# Patient Record
Sex: Female | Born: 1990 | Race: Black or African American | Hispanic: No | Marital: Single | Smoking: Never smoker
Health system: Southern US, Community
[De-identification: ages and names within clinical notes are randomized; demographics above are authoritative.]

## PROBLEM LIST (undated history)

## (undated) DIAGNOSIS — Z789 Other specified health status: Secondary | ICD-10-CM

## (undated) DIAGNOSIS — O139 Gestational [pregnancy-induced] hypertension without significant proteinuria, unspecified trimester: Secondary | ICD-10-CM

## (undated) HISTORY — PX: NO PAST SURGERIES: SHX2092

---

## 2015-03-09 ENCOUNTER — Ambulatory Visit (INDEPENDENT_AMBULATORY_CARE_PROVIDER_SITE_OTHER): Payer: Managed Care, Other (non HMO) | Admitting: Certified Nurse Midwife

## 2015-03-09 ENCOUNTER — Encounter: Payer: Self-pay | Admitting: Certified Nurse Midwife

## 2015-03-09 VITALS — BP 123/83 | HR 79 | Temp 98.2°F | Wt 129.6 lb

## 2015-03-09 DIAGNOSIS — Z30011 Encounter for initial prescription of contraceptive pills: Secondary | ICD-10-CM

## 2015-03-09 DIAGNOSIS — B373 Candidiasis of vulva and vagina: Secondary | ICD-10-CM

## 2015-03-09 DIAGNOSIS — B3731 Acute candidiasis of vulva and vagina: Secondary | ICD-10-CM

## 2015-03-09 DIAGNOSIS — R399 Unspecified symptoms and signs involving the genitourinary system: Secondary | ICD-10-CM | POA: Diagnosis not present

## 2015-03-09 DIAGNOSIS — N390 Urinary tract infection, site not specified: Secondary | ICD-10-CM

## 2015-03-09 LAB — POCT URINE PREGNANCY: Preg Test, Ur: NEGATIVE

## 2015-03-09 MED ORDER — NORGESTIM-ETH ESTRAD TRIPHASIC 0.18/0.215/0.25 MG-35 MCG PO TABS: 1.0000 | ORAL_TABLET | Freq: Every day | ORAL | Status: DC

## 2015-03-09 MED ORDER — FLUCONAZOLE 100 MG PO TABS: 100.0000 mg | ORAL_TABLET | Freq: Once | ORAL | Status: DC

## 2015-03-09 MED ORDER — NITROFURANTOIN MONOHYD MACRO 100 MG PO CAPS: 100.0000 mg | ORAL_CAPSULE | Freq: Two times a day (BID) | ORAL | Status: DC

## 2015-03-09 NOTE — Progress Notes (Signed)
Patient ID: Charlotte Walton, female   DOB: Sep 20, 1990, 24 y.o.   MRN: [id number]   Chief Complaint  Patient presents with  . Vaginitis    frequent yeast infection, possible UTI    HPI Charlotte Walton is a 24 y.o. female.  Here with c/o dysuria for about a week, along with vaginal discharge/itching.  Currently sexually active using condoms, desires OCPs.   Patient states that she gets >6 UTIs/year along with frequent yeast infections.   HPI  History reviewed. No pertinent past medical history.  History reviewed. No pertinent past surgical history.  History reviewed. No pertinent family history.  Social History Social History  Substance Use Topics  . Smoking status: Never Smoker   . Smokeless tobacco: None  . Alcohol Use: 0.0 oz/week    0 Standard drinks or equivalent per week    Not on File  Current Outpatient Prescriptions  Medication Sig Dispense Refill  . fluconazole (DIFLUCAN) 100 MG tablet Take 1 tablet (100 mg total) by mouth once. Repeat dose in 48-72 hour. 3 tablet 0  . nitrofurantoin, macrocrystal-monohydrate, (MACROBID) 100 MG capsule Take 1 capsule (100 mg total) by mouth 2 (two) times daily. 14 capsule 0  . Norgestimate-Ethinyl Estradiol Triphasic (TRI-SPRINTEC) 0.18/0.215/0.25 MG-35 MCG tablet Take 1 tablet by mouth daily. 1 Package 11   No current facility-administered medications for this visit.    Review of Systems Review of Systems Constitutional: negative for fatigue and weight loss Respiratory: negative for cough and wheezing Cardiovascular: negative for chest pain, fatigue and palpitations Gastrointestinal: negative for abdominal pain and change in bowel habits Genitourinary:+ dysuria, +vaginal discharge with itching Integument/breast: negative for nipple discharge Musculoskeletal:negative for myalgias Neurological: negative for gait problems and tremors Behavioral/Psych: negative for abusive relationship, depression Endocrine: negative for temperature  intolerance     Blood pressure 123/83, pulse 79, temperature 98.2 F (36.8 C), weight 129 lb 9.6 oz (58.786 kg), last menstrual period 02/26/2015.  Physical Exam Physical Exam General:   alert  Skin:   no rash or abnormalities  Lungs:   clear to auscultation bilaterally  Heart:   regular rate and rhythm, S1, S2 normal, no murmur, click, rub or gallop  Breasts:   normal without suspicious masses, skin or nipple changes or axillary nodes  Abdomen:  normal findings: no organomegaly, soft, non-tender and no hernia  Pelvis:  External genitalia: normal general appearance Urinary system: urethral meatus normal and bladder without fullness, nontender Vaginal: normal without tenderness, induration or masses.  + white thick vaginal discharge Cervix: normal appearance Adnexa: normal bimanual exam Uterus: anteverted and non-tender, normal size    50% of 25 min visit spent on counseling and coordination of care.   Data Reviewed Previous medical hx, medications  Assessment     UTI Vulvovaginal candidiasis Contraception counseling/management     Plan    Orders Placed This Encounter  Procedures  . Urine culture  . SureSwab, Vaginosis/Vaginitis Plus  . POCT urine pregnancy   Meds ordered this encounter  Medications  . fluconazole (DIFLUCAN) 100 MG tablet    Sig: Take 1 tablet (100 mg total) by mouth once. Repeat dose in 48-72 hour.    Dispense:  3 tablet    Refill:  0  . nitrofurantoin, macrocrystal-monohydrate, (MACROBID) 100 MG capsule    Sig: Take 1 capsule (100 mg total) by mouth 2 (two) times daily.    Dispense:  14 capsule    Refill:  0  . Norgestimate-Ethinyl Estradiol Triphasic (TRI-SPRINTEC) 0.18/0.215/0.25 MG-35 MCG tablet  Sig: Take 1 tablet by mouth daily.    Dispense:  1 Package    Refill:  11    Need to obtain previous records Possible management options include: urology consult Follow up with annual exam

## 2015-03-12 LAB — URINE CULTURE: Colony Count: >=100000

## 2015-03-13 ENCOUNTER — Other Ambulatory Visit: Payer: Self-pay | Admitting: Certified Nurse Midwife

## 2015-03-14 ENCOUNTER — Ambulatory Visit (INDEPENDENT_AMBULATORY_CARE_PROVIDER_SITE_OTHER): Payer: Managed Care, Other (non HMO) | Admitting: Certified Nurse Midwife

## 2015-03-14 ENCOUNTER — Encounter: Payer: Self-pay | Admitting: Certified Nurse Midwife

## 2015-03-14 ENCOUNTER — Other Ambulatory Visit: Payer: Self-pay | Admitting: Certified Nurse Midwife

## 2015-03-14 VITALS — BP 123/80 | HR 83 | Temp 99.0°F | Ht 65.5 in | Wt 132.0 lb

## 2015-03-14 DIAGNOSIS — Z01419 Encounter for gynecological examination (general) (routine) without abnormal findings: Secondary | ICD-10-CM

## 2015-03-14 DIAGNOSIS — Z113 Encounter for screening for infections with a predominantly sexual mode of transmission: Secondary | ICD-10-CM | POA: Diagnosis not present

## 2015-03-14 DIAGNOSIS — N390 Urinary tract infection, site not specified: Secondary | ICD-10-CM | POA: Diagnosis not present

## 2015-03-14 DIAGNOSIS — B3731 Acute candidiasis of vulva and vagina: Secondary | ICD-10-CM

## 2015-03-14 DIAGNOSIS — N760 Acute vaginitis: Secondary | ICD-10-CM | POA: Diagnosis not present

## 2015-03-14 DIAGNOSIS — B9689 Other specified bacterial agents as the cause of diseases classified elsewhere: Secondary | ICD-10-CM

## 2015-03-14 DIAGNOSIS — A499 Bacterial infection, unspecified: Secondary | ICD-10-CM | POA: Diagnosis not present

## 2015-03-14 DIAGNOSIS — R399 Unspecified symptoms and signs involving the genitourinary system: Secondary | ICD-10-CM | POA: Diagnosis not present

## 2015-03-14 DIAGNOSIS — B373 Candidiasis of vulva and vagina: Secondary | ICD-10-CM | POA: Diagnosis not present

## 2015-03-14 LAB — CBC WITH DIFFERENTIAL/PLATELET
Basophils Absolute: 0 10*3/uL (ref 0.0–0.1)
Basophils Relative: 0 % (ref 0–1)
EOS ABS: 0.4 10*3/uL (ref 0.0–0.7)
Eosinophils Relative: 3 % (ref 0–5)
HCT: 40.6 % (ref 36.0–46.0)
HEMOGLOBIN: 13.5 g/dL (ref 12.0–15.0)
LYMPHS ABS: 2.6 10*3/uL (ref 0.7–4.0)
Lymphocytes Relative: 22 % (ref 12–46)
MCH: 29.7 pg (ref 26.0–34.0)
MCHC: 33.3 g/dL (ref 30.0–36.0)
MCV: 89.2 fL (ref 78.0–100.0)
MONO ABS: 0.8 10*3/uL (ref 0.1–1.0)
MONOS PCT: 7 % (ref 3–12)
MPV: 9.7 fL (ref 8.6–12.4)
NEUTROS PCT: 68 % (ref 43–77)
Neutro Abs: 8 10*3/uL — ABNORMAL HIGH (ref 1.7–7.7)
PLATELETS: 274 10*3/uL (ref 150–400)
RBC: 4.55 MIL/uL (ref 3.87–5.11)
RDW: 12.7 % (ref 11.5–15.5)
WBC: 11.8 10*3/uL — ABNORMAL HIGH (ref 4.0–10.5)

## 2015-03-14 LAB — COMPREHENSIVE METABOLIC PANEL
ALBUMIN: 4.5 g/dL (ref 3.6–5.1)
ALT: 16 U/L (ref 6–29)
AST: 21 U/L (ref 10–30)
Alkaline Phosphatase: 45 U/L (ref 33–115)
BUN: 16 mg/dL (ref 7–25)
CHLORIDE: 101 mmol/L (ref 98–110)
CO2: 28 mmol/L (ref 20–31)
Calcium: 9.3 mg/dL (ref 8.6–10.2)
Creat: 0.94 mg/dL (ref 0.50–1.10)
Glucose, Bld: 80 mg/dL (ref 65–99)
POTASSIUM: 3.8 mmol/L (ref 3.5–5.3)
Sodium: 139 mmol/L (ref 135–146)
TOTAL PROTEIN: 7.4 g/dL (ref 6.1–8.1)
Total Bilirubin: 1.1 mg/dL (ref 0.2–1.2)

## 2015-03-14 LAB — SURESWAB, VAGINOSIS/VAGINITIS PLUS
Atopobium vaginae: NOT DETECTED Log (cells/mL)
BV CATEGORY: UNDETERMINED — AB
C. GLABRATA, DNA: NOT DETECTED
C. TROPICALIS, DNA: NOT DETECTED
C. albicans, DNA: NOT DETECTED
C. parapsilosis, DNA: NOT DETECTED
C. trachomatis RNA, TMA: NOT DETECTED
Gardnerella vaginalis: 6.4 Log (cells/mL)
LACTOBACILLUS SPECIES: 6.8 Log (cells/mL)
MEGASPHAERA SPECIES: NOT DETECTED Log (cells/mL)
N. gonorrhoeae RNA, TMA: NOT DETECTED
T. vaginalis RNA, QL TMA: NOT DETECTED

## 2015-03-14 LAB — HDL CHOLESTEROL: HDL: 60 mg/dL (ref >=46)

## 2015-03-14 LAB — CHOLESTEROL, TOTAL: CHOLESTEROL: 131 mg/dL (ref 125–200)

## 2015-03-14 LAB — TRIGLYCERIDES: Triglycerides: 83 mg/dL (ref <150)

## 2015-03-14 MED ORDER — METRONIDAZOLE 500 MG PO TABS: 500.0000 mg | ORAL_TABLET | Freq: Two times a day (BID) | ORAL | Status: DC

## 2015-03-14 MED ORDER — FLUCONAZOLE 100 MG PO TABS: 100.0000 mg | ORAL_TABLET | Freq: Once | ORAL | Status: DC

## 2015-03-14 MED ORDER — HYLAFEM VA SUPP: 1.0000 | Freq: Every day | VAGINAL | Status: DC

## 2015-03-14 MED ORDER — TERCONAZOLE 0.4 % VA CREA: 1.0000 | TOPICAL_CREAM | Freq: Every day | VAGINAL | Status: DC

## 2015-03-14 NOTE — Patient Instructions (Signed)
Bacterial Vaginosis °Bacterial vaginosis is a vaginal infection that occurs when the normal balance of bacteria in the vagina is disrupted. It results from an overgrowth of certain bacteria. This is the most common vaginal infection in women of childbearing age. Treatment is important to prevent complications, especially in pregnant women, as it can cause a premature delivery. °CAUSES  °Bacterial vaginosis is caused by an increase in harmful bacteria that are normally present in smaller amounts in the vagina. Several different kinds of bacteria can cause bacterial vaginosis. However, the reason that the condition develops is not fully understood. °RISK FACTORS °Certain activities or behaviors can put you at an increased risk of developing bacterial vaginosis, including: °· Having a new sex partner or multiple sex partners. °· Douching. °· Using an intrauterine device (IUD) for contraception. °Women do not get bacterial vaginosis from toilet seats, bedding, swimming pools, or contact with objects around them. °SIGNS AND SYMPTOMS  °Some women with bacterial vaginosis have no signs or symptoms. Common symptoms include: °· Grey vaginal discharge. °· A fishlike odor with discharge, especially after sexual intercourse. °· Itching or burning of the vagina and vulva. °· Burning or pain with urination. °DIAGNOSIS  °Your health care provider will take a medical history and examine the vagina for signs of bacterial vaginosis. A sample of vaginal fluid may be taken. Your health care provider will look at this sample under a microscope to check for bacteria and abnormal cells. A vaginal pH test may also be done.  °TREATMENT  °Bacterial vaginosis may be treated with antibiotic medicines. These may be given in the form of a pill or a vaginal cream. A second round of antibiotics may be prescribed if the condition comes back after treatment.  °HOME CARE INSTRUCTIONS  °· Only take over-the-counter or prescription medicines as  directed by your health care provider. °· If antibiotic medicine was prescribed, take it as directed. Make sure you finish it even if you start to feel better. °· Do not have sex until treatment is completed. °· Tell all sexual partners that you have a vaginal infection. They should see their health care provider and be treated if they have problems, such as a mild rash or itching. °· Practice safe sex by using condoms and only having one sex partner. °SEEK MEDICAL CARE IF:  °· Your symptoms are not improving after 3 days of treatment. °· You have increased discharge or pain. °· You have a fever. °MAKE SURE YOU:  °· Understand these instructions. °· Will watch your condition. °· Will get help right away if you are not doing well or get worse. °FOR MORE INFORMATION  °Centers for Disease Control and Prevention, Division of STD Prevention: www.cdc.gov/std °American Sexual Health Association (ASHA): www.ashastd.org  °Document Released: 07/14/2005 Document Revised: 05/04/2013 Document Reviewed: 02/23/2013 °ExitCare® Patient Information ©2015 [employer]. This information is not intended to replace advice given to you by your health care provider. Make sure you discuss any questions you have with your health care provider. °Candidal Vulvovaginitis °Candidal vulvovaginitis is an infection of the vagina and vulva. The vulva is the skin around the opening of the vagina. This may cause itching and discomfort in and around the vagina.  °HOME CARE °· Only take medicine as told by your doctor. °· Do not have sex (intercourse) until the infection is healed or as told by your doctor. °· Practice safe sex. °· Tell your sex partner about your infection. °· Do not douche or use tampons. °· Wear cotton   underwear. Do not wear tight pants or panty hose. °· Eat yogurt. This may help treat and prevent yeast infections. °GET HELP RIGHT AWAY IF:  °· You have a fever. °· Your problems get worse during treatment or do not get better in 3  days. °· You have discomfort, irritation, or itching in your vagina or vulva area. °· You have pain after sex. °· You start to get belly (abdominal) pain. °MAKE SURE YOU: °· Understand these instructions. °· Will watch your condition. °· Will get help right away if you are not doing well or get worse. °Document Released: 10/10/2008 Document Revised: 07/19/2013 Document Reviewed: 10/10/2008 °ExitCare® Patient Information ©2015 [employer]. This information is not intended to replace advice given to you by your health care provider. Make sure you discuss any questions you have with your health care provider. ° °

## 2015-03-14 NOTE — Progress Notes (Signed)
Patient ID: Charlotte Walton, female   DOB: 06/22/1991, 24 y.o.   MRN: [id number]   Subjective:      Charlotte Walton is a 24 y.o. female here for a routine exam.  Current complaints: none.  Menses regular, last about 5-6 days, denies heavy bleeding or clots, cramping.  States that she gets chronic BV/yeast infections.  Sexually active, last encounter a few weeks ago, did not use condoms.    Works full time.  Desires full std screening exam.    Personal health questionnaire:  Is patient Ashkenazi Jewish, have a family history of breast and/or ovarian cancer: no Is there a family history of uterine cancer diagnosed at age < 65, gastrointestinal cancer, urinary tract cancer, family member who is a Personnel officer syndrome-associated carrier: no Is the patient overweight and hypertensive, family history of diabetes, personal history of gestational diabetes, preeclampsia or PCOS: no Is patient over 43, have PCOS,  family history of premature CHD under age 30, diabetes, smoke, have hypertension or peripheral artery disease:  no At any time, has a partner hit, kicked or otherwise hurt or frightened you?: no Over the past 2 weeks, have you felt down, depressed or hopeless?: no Over the past 2 weeks, have you felt little interest or pleasure in doing things?:no   Gynecologic History Patient's last menstrual period was 02/26/2015. Contraception: OCP (estrogen/progesterone) Last Pap: 03/11/2014. Results were: normal Last mammogram: N/A.   Obstetric History OB History  Gravida Para Term Preterm AB SAB TAB Ectopic Multiple Living  0 0 0 0 0 0 0 0 0 0         No past medical history on file.  No past surgical history on file.   Current outpatient prescriptions:  .  metroNIDAZOLE (FLAGYL) 500 MG tablet, Take 1 tablet (500 mg total) by mouth 2 (two) times daily., Disp: 14 tablet, Rfl: 0 .  nitrofurantoin, macrocrystal-monohydrate, (MACROBID) 100 MG capsule, Take 1 capsule (100 mg total) by mouth 2 (two) times  daily., Disp: 14 capsule, Rfl: 0 .  Norgestimate-Ethinyl Estradiol Triphasic (TRI-SPRINTEC) 0.18/0.215/0.25 MG-35 MCG tablet, Take 1 tablet by mouth daily., Disp: 1 Package, Rfl: 11 .  fluconazole (DIFLUCAN) 100 MG tablet, Take 1 tablet (100 mg total) by mouth once. Repeat dose in 48-72 hour., Disp: 3 tablet, Rfl: 0 .  Homeopathic Products (HYLAFEM) SUPP, Place 1 suppository vaginally at bedtime., Disp: 6 suppository, Rfl: 0 .  terconazole (TERAZOL 7) 0.4 % vaginal cream, Place 1 applicator vaginally at bedtime., Disp: 45 g, Rfl: 0 Not on File  Social History  Substance Use Topics  . Smoking status: Never Smoker   . Smokeless tobacco: Not on file  . Alcohol Use: 0.0 oz/week    0 Standard drinks or equivalent per week    No family history on file.    Review of Systems  Constitutional: negative for fatigue and weight loss Respiratory: negative for cough and wheezing Cardiovascular: negative for chest pain, fatigue and palpitations Gastrointestinal: negative for abdominal pain and change in bowel habits Musculoskeletal:negative for myalgias Neurological: negative for gait problems and tremors Behavioral/Psych: negative for abusive relationship, depression Endocrine: negative for temperature intolerance   Genitourinary:negative for abnormal menstrual periods, genital lesions, hot flashes, sexual problems and vaginal discharge Integument/breast: negative for breast lump, breast tenderness, nipple discharge and skin lesion(s)    Objective:       BP 123/80 mmHg  Pulse 83  Temp(Src) 99 F (37.2 C)  Ht 5' 5.5" (1.664 m)  Wt 132 lb (59.875 kg)  BMI 21.62 kg/m2  LMP 02/26/2015 General:   alert  Skin:   no rash or abnormalities  Lungs:   clear to auscultation bilaterally  Heart:   regular rate and rhythm, S1, S2 normal, no murmur, click, rub or gallop  Breasts:   normal without suspicious masses, skin or nipple changes or axillary nodes  Abdomen:  normal findings: no organomegaly,  soft, non-tender and no hernia  Pelvis:  External genitalia: normal general appearance Urinary system: urethral meatus normal and bladder without fullness, nontender Vaginal: normal without tenderness, induration or masses Cervix: normal appearance Adnexa: normal bimanual exam Uterus: anteverted and non-tender, normal size   Lab Review Urine pregnancy test Labs reviewed yes Radiologic studies reviewed no  50% of 30 min visit spent on counseling and coordination of care.   Assessment:    Healthy female exam.   Chronic BV/yeast  Plan:    Education reviewed: calcium supplements, depression evaluation, low fat, low cholesterol diet, safe sex/STD prevention, self breast exams, skin cancer screening and weight bearing exercise. Contraception: OCP (estrogen/progesterone). Follow up in: 1 year.   Meds ordered this encounter  Medications  . Homeopathic Products (HYLAFEM) SUPP    Sig: Place 1 suppository vaginally at bedtime.    Dispense:  6 suppository    Refill:  0  . fluconazole (DIFLUCAN) 100 MG tablet    Sig: Take 1 tablet (100 mg total) by mouth once. Repeat dose in 48-72 hour.    Dispense:  3 tablet    Refill:  0  . metroNIDAZOLE (FLAGYL) 500 MG tablet    Sig: Take 1 tablet (500 mg total) by mouth 2 (two) times daily.    Dispense:  14 tablet    Refill:  0  . terconazole (TERAZOL 7) 0.4 % vaginal cream    Sig: Place 1 applicator vaginally at bedtime.    Dispense:  45 g    Refill:  0   Orders Placed This Encounter  Procedures  . HIV antibody (with reflex)  . Hepatitis B surface antigen  . RPR  . Hepatitis C antibody  . CBC with Differential/Platelet  . Comprehensive metabolic panel  . TSH  . Cholesterol, total  . Triglycerides  . HDL cholesterol  . Hemoglobin A1c   Need to obtain previous records Possible management options include:Metrogel 2x/week for 4-6 months

## 2015-03-15 LAB — HEPATITIS B SURFACE ANTIGEN: Hepatitis B Surface Ag: NEGATIVE

## 2015-03-15 LAB — HEMOGLOBIN A1C
HEMOGLOBIN A1C: 5.6 % (ref <5.7)
Mean Plasma Glucose: 114 mg/dL (ref <117)

## 2015-03-15 LAB — PAP IG W/ RFLX HPV ASCU

## 2015-03-15 LAB — HEPATITIS C ANTIBODY: HCV Ab: NEGATIVE

## 2015-03-15 LAB — RPR

## 2015-03-15 LAB — HIV ANTIBODY (ROUTINE TESTING W REFLEX): HIV: NONREACTIVE

## 2015-03-15 LAB — TSH: TSH: 2.475 u[IU]/mL (ref 0.350–4.500)

## 2015-07-29 NOTE — L&D Delivery Note (Addendum)
Delivery Note   At 3:44 PM a viable female named Charlotte Walton was delivered via Vaginal, Spontaneous Delivery (Presentation: LOA ).  APGAR:pending NICU input  weight 3 lb 1.4 oz (1400 g).   Placenta status: spontaneous, complete with 3 Vx Cord. 1 loose nuchal cord delivered over shoulder Cord pH: pending  Anesthesia:  Epidural Episiotomy: None Lacerations: None Est. Blood Loss (mL):  300  Mom to postpartum.  Baby to NICU.  Nakema Fake A 04/08/2016, 4:19 PM  Apgars 3,5,7 Cord pH 7.27

## 2015-08-10 ENCOUNTER — Other Ambulatory Visit: Payer: Self-pay | Admitting: Obstetrics

## 2015-08-13 ENCOUNTER — Encounter (HOSPITAL_COMMUNITY): Payer: Self-pay | Admitting: Emergency Medicine

## 2015-08-13 ENCOUNTER — Emergency Department (HOSPITAL_COMMUNITY)
Admission: EM | Admit: 2015-08-13 | Discharge: 2015-08-14 | Discharge status: Against Medical Advice | Payer: Self-pay | Attending: Emergency Medicine | Admitting: Emergency Medicine

## 2015-08-13 DIAGNOSIS — R1013 Epigastric pain: Secondary | ICD-10-CM | POA: Insufficient documentation

## 2015-08-13 DIAGNOSIS — R51 Headache: Secondary | ICD-10-CM | POA: Insufficient documentation

## 2015-08-13 DIAGNOSIS — R079 Chest pain, unspecified: Secondary | ICD-10-CM | POA: Insufficient documentation

## 2015-08-13 DIAGNOSIS — R509 Fever, unspecified: Secondary | ICD-10-CM | POA: Insufficient documentation

## 2015-08-13 DIAGNOSIS — R61 Generalized hyperhidrosis: Secondary | ICD-10-CM | POA: Insufficient documentation

## 2015-08-13 NOTE — ED Notes (Signed)
Pt states she went to her dr today and he sent her here for further testing  Pt states she has had fever, headache, night sweats, and chills for the past couple of days  Pt states she has had some chest pain and stomach pain  Pt states during the day she feels okay  Pt states today she started having a stiff neck so she went to urgent care and was sent here to rule out meningitis

## 2015-08-14 NOTE — ED Notes (Signed)
Patient called for assigned room, no answer.

## 2015-08-14 NOTE — ED Notes (Signed)
Pt called for room, no answer.

## 2015-08-14 NOTE — ED Notes (Signed)
Called to take to treatment room  No response from lobby 

## 2015-09-29 ENCOUNTER — Other Ambulatory Visit: Payer: Self-pay | Admitting: Obstetrics

## 2015-11-12 LAB — OB RESULTS CONSOLE RUBELLA ANTIBODY, IGM: RUBELLA: IMMUNE

## 2015-11-12 LAB — OB RESULTS CONSOLE GC/CHLAMYDIA
CHLAMYDIA, DNA PROBE: NEGATIVE
Gonorrhea: NEGATIVE

## 2015-11-12 LAB — OB RESULTS CONSOLE HEPATITIS B SURFACE ANTIGEN: HEP B S AG: NEGATIVE

## 2015-11-12 LAB — OB RESULTS CONSOLE RPR: RPR: NONREACTIVE

## 2015-11-12 LAB — OB RESULTS CONSOLE HIV ANTIBODY (ROUTINE TESTING): HIV: NONREACTIVE

## 2016-03-18 ENCOUNTER — Ambulatory Visit: Payer: Managed Care, Other (non HMO) | Admitting: Certified Nurse Midwife

## 2016-03-19 ENCOUNTER — Encounter (HOSPITAL_COMMUNITY): Payer: Self-pay | Admitting: *Deleted

## 2016-03-19 ENCOUNTER — Observation Stay (HOSPITAL_COMMUNITY)
Admission: AD | Admit: 2016-03-19 | Discharge: 2016-03-20 | Disposition: A | Discharge status: Home / Self Care | Payer: Managed Care, Other (non HMO) | Source: Ambulatory Visit | Attending: Obstetrics & Gynecology | Admitting: Obstetrics & Gynecology

## 2016-03-19 DIAGNOSIS — O133 Gestational [pregnancy-induced] hypertension without significant proteinuria, third trimester: Secondary | ICD-10-CM | POA: Diagnosis present

## 2016-03-19 DIAGNOSIS — Z3A28 28 weeks gestation of pregnancy: Secondary | ICD-10-CM | POA: Diagnosis not present

## 2016-03-19 DIAGNOSIS — O1493 Unspecified pre-eclampsia, third trimester: Secondary | ICD-10-CM | POA: Diagnosis not present

## 2016-03-19 DIAGNOSIS — O1490 Unspecified pre-eclampsia, unspecified trimester: Secondary | ICD-10-CM

## 2016-03-19 DIAGNOSIS — Z1389 Encounter for screening for other disorder: Secondary | ICD-10-CM

## 2016-03-19 HISTORY — DX: Gestational (pregnancy-induced) hypertension without significant proteinuria, unspecified trimester: O13.9

## 2016-03-19 HISTORY — DX: Other specified health status: Z78.9

## 2016-03-19 LAB — COMPREHENSIVE METABOLIC PANEL
ALT: 23 U/L (ref 14–54)
AST: 26 U/L (ref 15–41)
Albumin: 3 g/dL — ABNORMAL LOW (ref 3.5–5.0)
Alkaline Phosphatase: 82 U/L (ref 38–126)
Anion gap: 8 (ref 5–15)
BUN: 10 mg/dL (ref 6–20)
CHLORIDE: 107 mmol/L (ref 101–111)
CO2: 21 mmol/L — AB (ref 22–32)
CREATININE: 0.68 mg/dL (ref 0.44–1.00)
Calcium: 8.5 mg/dL — ABNORMAL LOW (ref 8.9–10.3)
GFR calc non Af Amer: >60 mL/min (ref >60)
Glucose, Bld: 77 mg/dL (ref 65–99)
POTASSIUM: 4.1 mmol/L (ref 3.5–5.1)
SODIUM: 136 mmol/L (ref 135–145)
Total Bilirubin: 0.5 mg/dL (ref 0.3–1.2)
Total Protein: 6.8 g/dL (ref 6.5–8.1)

## 2016-03-19 LAB — CBC
HCT: 32.9 % — ABNORMAL LOW (ref 36.0–46.0)
Hemoglobin: 11.6 g/dL — ABNORMAL LOW (ref 12.0–15.0)
MCH: 30.4 pg (ref 26.0–34.0)
MCHC: 35.3 g/dL (ref 30.0–36.0)
MCV: 86.4 fL (ref 78.0–100.0)
PLATELETS: 211 10*3/uL (ref 150–400)
RBC: 3.81 MIL/uL — ABNORMAL LOW (ref 3.87–5.11)
RDW: 13.5 % (ref 11.5–15.5)
WBC: 14.2 10*3/uL — AB (ref 4.0–10.5)

## 2016-03-19 LAB — URINALYSIS, ROUTINE W REFLEX MICROSCOPIC
BILIRUBIN URINE: NEGATIVE
Glucose, UA: NEGATIVE mg/dL
KETONES UR: NEGATIVE mg/dL
Leukocytes, UA: NEGATIVE
NITRITE: NEGATIVE
PROTEIN: 100 mg/dL — AB
SPECIFIC GRAVITY, URINE: 1.015 (ref 1.005–1.030)
pH: 6 (ref 5.0–8.0)

## 2016-03-19 LAB — URINE MICROSCOPIC-ADD ON
Bacteria, UA: NONE SEEN
RBC / HPF: NONE SEEN RBC/hpf (ref 0–5)

## 2016-03-19 LAB — ABO/RH: ABO/RH(D): AB POS

## 2016-03-19 LAB — TYPE AND SCREEN
ABO/RH(D): AB POS
ANTIBODY SCREEN: NEGATIVE

## 2016-03-19 LAB — PROTEIN / CREATININE RATIO, URINE
Creatinine, Urine: 112 mg/dL
Protein Creatinine Ratio: 0.87 mg/mg{Cre} — ABNORMAL HIGH (ref 0.00–0.15)
TOTAL PROTEIN, URINE: 97 mg/dL

## 2016-03-19 MED ORDER — CALCIUM CARBONATE ANTACID 500 MG PO CHEW: 2.0000 | CHEWABLE_TABLET | ORAL | Status: DC | PRN

## 2016-03-19 MED ORDER — PRENATAL MULTIVITAMIN CH
1.0000 | ORAL_TABLET | Freq: Every day | ORAL | Status: DC
  Administered 2016-03-20: 1 via ORAL
  Filled 2016-03-19: qty 1

## 2016-03-19 MED ORDER — HYDRALAZINE HCL 20 MG/ML IJ SOLN: 10.0000 mg | Freq: Once | INTRAMUSCULAR | Status: DC | PRN

## 2016-03-19 MED ORDER — BETAMETHASONE SOD PHOS & ACET 6 (3-3) MG/ML IJ SUSP
12.0000 mg | INTRAMUSCULAR | Status: AC
Start: 2016-03-19 — End: 2016-03-20
  Administered 2016-03-19 – 2016-03-20 (×2): 12 mg via INTRAMUSCULAR
  Filled 2016-03-19 (×2): qty 2

## 2016-03-19 MED ORDER — LABETALOL HCL 5 MG/ML IV SOLN: 20.0000 mg | INTRAVENOUS | Status: DC | PRN

## 2016-03-19 MED ORDER — ACETAMINOPHEN 325 MG PO TABS: 650.0000 mg | ORAL_TABLET | ORAL | Status: DC | PRN

## 2016-03-19 MED ORDER — ZOLPIDEM TARTRATE 5 MG PO TABS: 5.0000 mg | ORAL_TABLET | Freq: Every evening | ORAL | Status: DC | PRN

## 2016-03-19 MED ORDER — DOCUSATE SODIUM 100 MG PO CAPS
100.0000 mg | ORAL_CAPSULE | Freq: Every day | ORAL | Status: DC
  Filled 2016-03-19: qty 1

## 2016-03-19 NOTE — MAU Note (Signed)
Pt states her BP at [facility] was 145/102 prior to coming to MAU.  Denies HA, visual changes, or epigastric pain.  Pt states she has noticed some swelling in arms, face, feet, ankles, and hands.  Good fetal movement.

## 2016-03-19 NOTE — MAU Provider Note (Signed)
Chief Complaint:  Hypertension   First Provider Initiated Contact with Patient 03/19/16 1322      Charlotte Walton is a 25 y.o. G1P0000 at [id number] who presents to maternity admissions reporting elevated blood pressure seen at drug store.  Also has generalized edema, but states that is better today. . She reports good fetal movement, denies LOF, vaginal bleeding, vaginal itching/burning, urinary symptoms, h/a, dizziness, n/v, diarrhea, constipation or fever/chills.  She denies headache, visual changes or RUQ abdominal pain.  Hypertension  This is a new problem. The current episode started today. The problem is unchanged. Associated symptoms include peripheral edema. Pertinent negatives include no anxiety, blurred vision, chest pain, headaches, malaise/fatigue or shortness of breath. There are no associated agents to hypertension. There are no known risk factors for coronary artery disease. Past treatments include nothing. There are no compliance problems.    RN note: Pt states her BP at [facility] was 145/102 prior to coming to MAU.  Denies HA, visual changes, or epigastric pain.  Pt states she has noticed some swelling in arms, face, feet, ankles, and hands.  Good fetal movement  Past Medical History: Past Medical History:  Diagnosis Date  . Medical history non-contributory     Past obstetric history: OB History  Gravida Para Term Preterm AB Living  1 0 0 0 0 0  SAB TAB Ectopic Multiple Live Births  0 0 0 0      # Outcome Date GA Lbr Len/2nd Weight Sex Delivery Anes PTL Lv  1 Current               Past Surgical History: Past Surgical History:  Procedure Laterality Date  . NO PAST SURGERIES      Family History: History reviewed. No pertinent family history.  Social History: Social History  Substance Use Topics  . Smoking status: Never Smoker  . Smokeless tobacco: Never Used  . Alcohol use No     Comment: occ    Allergies: No Known Allergies  Meds:  Prescriptions Prior to  Admission  Medication Sig Dispense Refill Last Dose  . fluconazole (DIFLUCAN) 100 MG tablet Take 1 tablet (100 mg total) by mouth once. Repeat dose in 48-72 hour. 3 tablet 0   . Homeopathic Products (HYLAFEM) SUPP Place 1 suppository vaginally at bedtime. 6 suppository 0   . metroNIDAZOLE (FLAGYL) 500 MG tablet Take 1 tablet (500 mg total) by mouth 2 (two) times daily. 14 tablet 0   . nitrofurantoin, macrocrystal-monohydrate, (MACROBID) 100 MG capsule TAKE 1 CAPSULE(100 MG) BY MOUTH TWICE DAILY 14 capsule 0   . Norgestimate-Ethinyl Estradiol Triphasic (TRI-SPRINTEC) 0.18/0.215/0.25 MG-35 MCG tablet Take 1 tablet by mouth daily. 1 Package 11 Taking  . terconazole (TERAZOL 7) 0.4 % vaginal cream Place 1 applicator vaginally at bedtime. 45 g 0     I have reviewed patient's Past Medical Hx, Surgical Hx, Family Hx, Social Hx, medications and allergies.   ROS:  Review of Systems  Constitutional: Negative for chills, fever and malaise/fatigue.  Eyes: Negative for blurred vision.  Respiratory: Negative for shortness of breath.   Cardiovascular: Negative for chest pain.  Gastrointestinal: Negative for abdominal pain, constipation, diarrhea, nausea and vomiting.  Genitourinary: Negative for dysuria, pelvic pain and vaginal bleeding.  Skin:       Swelling all over  Neurological: Negative for headaches.   Other systems negative  Physical Exam  Patient Vitals for the past 24 hrs:  BP Temp Temp src Pulse Resp SpO2 Height Weight  03/19/16 1247 (!)  146/101 - - 85 - - - -  03/19/16 1232 148/92 - - 90 - - - -  03/19/16 1216 151/97 - - 91 - - - -  03/19/16 1201 145/97 - - 88 - - - -  03/19/16 1151 - - - - - - 5' 7.5" (1.715 m) 176 lb 12.8 oz (80.2 kg)  03/19/16 1147 143/96 - - 79 - 98 % - -  03/19/16 1141 158/96 98.6 F (37 C) Oral 80 18 98 % - -   Constitutional: Well-developed, well-nourished female in no acute distress.  Cardiovascular: normal rate and rhythm Respiratory: normal effort, clear  to auscultation bilaterally GI: Abd soft, non-tender, gravid appropriate for gestational age.   No rebound or guarding. MS: Extremities nontender, Trace edema, normal ROM Neurologic: Alert and oriented x 4. DTRs 3+ with one beat of clonus GU: Neg CVAT.  FHT:  Baseline 145 , moderate variability, accelerations present, no decelerations Contractions:  Rare   Labs: Results for orders placed or performed during the hospital encounter of 03/19/16 (from the past 24 hour(s))  Urinalysis, Routine w reflex microscopic (not at [facility])     Status: Abnormal   Collection Time: 03/19/16 11:25 AM  Result Value Ref Range   Color, Urine YELLOW YELLOW   APPearance HAZY (A) CLEAR   Specific Gravity, Urine 1.015 1.005 - 1.030   pH 6.0 5.0 - 8.0   Glucose, UA NEGATIVE NEGATIVE mg/dL   Hgb urine dipstick TRACE (A) NEGATIVE   Bilirubin Urine NEGATIVE NEGATIVE   Ketones, ur NEGATIVE NEGATIVE mg/dL   Protein, ur 295100 (A) NEGATIVE mg/dL   Nitrite NEGATIVE NEGATIVE   Leukocytes, UA NEGATIVE NEGATIVE  Urine microscopic-add on     Status: Abnormal   Collection Time: 03/19/16 11:25 AM  Result Value Ref Range   Squamous Epithelial / LPF 6-30 (A) NONE SEEN   WBC, UA 0-5 0 - 5 WBC/hpf   RBC / HPF NONE SEEN 0 - 5 RBC/hpf   Bacteria, UA NONE SEEN NONE SEEN  Protein / creatinine ratio, urine     Status: Abnormal   Collection Time: 03/19/16 11:25 AM  Result Value Ref Range   Creatinine, Urine 112.00 mg/dL   Total Protein, Urine 97 mg/dL   Protein Creatinine Ratio 0.87 (H) 0.00 - 0.15 mg/mg[Cre]  CBC     Status: Abnormal   Collection Time: 03/19/16  1:27 PM  Result Value Ref Range   WBC 14.2 (H) 4.0 - 10.5 K/uL   RBC 3.81 (L) 3.87 - 5.11 MIL/uL   Hemoglobin 11.6 (L) 12.0 - 15.0 g/dL   HCT 28.432.9 (L) 13.236.0 - 44.046.0 %   MCV 86.4 78.0 - 100.0 fL   MCH 30.4 26.0 - 34.0 pg   MCHC 35.3 30.0 - 36.0 g/dL   RDW 10.213.5 72.511.5 - 36.615.5 %   Platelets 211 150 - 400 K/uL  Comprehensive metabolic panel     Status: Abnormal    Collection Time: 03/19/16  1:27 PM  Result Value Ref Range   Sodium 136 135 - 145 mmol/L   Potassium 4.1 3.5 - 5.1 mmol/L   Chloride 107 101 - 111 mmol/L   CO2 21 (L) 22 - 32 mmol/L   Glucose, Bld 77 65 - 99 mg/dL   BUN 10 6 - 20 mg/dL   Creatinine, Ser 4.400.68 0.44 - 1.00 mg/dL   Calcium 8.5 (L) 8.9 - 10.3 mg/dL   Total Protein 6.8 6.5 - 8.1 g/dL   Albumin 3.0 (L) 3.5 - 5.0  g/dL   AST 26 15 - 41 U/L   ALT 23 14 - 54 U/L   Alkaline Phosphatase 82 38 - 126 U/L   Total Bilirubin 0.5 0.3 - 1.2 mg/dL   GFR calc non Af Amer >60 >60 mL/min   GFR calc Af Amer >60 >60 mL/min   Anion gap 8 5 - 15    Imaging:  No results found.  MAU Course/MDM: I have ordered labs and reviewed results.  NST reviewed Consult Dr Su Hilt with presentation, exam findings and test results.  Will admit for betamethasone and observation  Assessment: [facility] at [redacted]w[redacted]d Preeclampsia  Plan: Admit to Antenatal per DR Su Hilt Routine antenatal orders Betamethasone series MD to follow  Wynelle Bourgeois CNM, MSN Certified Nurse-Midwife 03/19/2016 1:22 PM

## 2016-03-19 NOTE — H&P (Signed)
Chief Complaint:  Hypertension   First Provider Initiated Contact with Patient 03/19/16 1322      Charlotte Walton is a 25 y.o. G1P0000 at [redacted]w[redacted]d who presents to maternity admissions reporting elevated blood pressure seen at drug store.  Also has generalized edema, but states that is better today. . She reports good fetal movement, denies LOF, vaginal bleeding, vaginal itching/burning, urinary symptoms, h/a, dizziness, n/v, diarrhea, constipation or fever/chills.  She denies headache, visual changes or RUQ abdominal pain.  Hypertension  This is a new problem. The current episode started today. The problem is unchanged. Associated symptoms include peripheral edema. Pertinent negatives include no anxiety, blurred vision, chest pain, headaches, malaise/fatigue or shortness of breath. There are no associated agents to hypertension. There are no known risk factors for coronary artery disease. Past treatments include nothing. There are no compliance problems.    RN note: Pt states her BP at [facility] was 145/102 prior to coming to [facility]. Denies HA, visual changes, or epigastric pain. Pt states she has noticed some swelling in arms, face, feet, ankles, and hands. Good fetal movement  Past Medical History:     Past Medical History:  Diagnosis Date  . Medical history non-contributory     Past obstetric history:                 OB History  Gravida Para Term Preterm AB Living  1 0 0 0 0 0  SAB TAB Ectopic Multiple Live Births  0 0 0 0      # Outcome Date GA Lbr Len/2nd Weight Sex Delivery Anes PTL Lv  1 Current               Past Surgical History:      Past Surgical History:  Procedure Laterality Date  . NO PAST SURGERIES      Family History: History reviewed. No pertinent family history.  Social History:       Social History  Substance Use Topics  . Smoking status: Never Smoker  . Smokeless tobacco: Never Used  . Alcohol use No     Comment: occ     Allergies: No Known Allergies  Meds:         Prescriptions Prior to Admission  Medication Sig Dispense Refill Last Dose  . fluconazole (DIFLUCAN) 100 MG tablet Take 1 tablet (100 mg total) by mouth once. Repeat dose in 48-72 hour. 3 tablet 0   . Homeopathic Products (HYLAFEM) SUPP Place 1 suppository vaginally at bedtime. 6 suppository 0   . metroNIDAZOLE (FLAGYL) 500 MG tablet Take 1 tablet (500 mg total) by mouth 2 (two) times daily. 14 tablet 0   . nitrofurantoin, macrocrystal-monohydrate, (MACROBID) 100 MG capsule TAKE 1 CAPSULE(100 MG) BY MOUTH TWICE DAILY 14 capsule 0   . Norgestimate-Ethinyl Estradiol Triphasic (TRI-SPRINTEC) 0.18/0.215/0.25 MG-35 MCG tablet Take 1 tablet by mouth daily. 1 Package 11 Taking  . terconazole (TERAZOL 7) 0.4 % vaginal cream Place 1 applicator vaginally at bedtime. 45 g 0     I have reviewed patient's Past Medical Hx, Surgical Hx, Family Hx, Social Hx, medications and allergies.   ROS:  Review of Systems  Constitutional: Negative for chills, fever and malaise/fatigue.  Eyes: Negative for blurred vision.  Respiratory: Negative for shortness of breath.   Cardiovascular: Negative for chest pain.  Gastrointestinal: Negative for abdominal pain, constipation, diarrhea, nausea and vomiting.  Genitourinary: Negative for dysuria, pelvic pain and vaginal bleeding.  Skin:       Swelling all  over  Neurological: Negative for headaches.   Other systems negative  Physical Exam  Patient Vitals for the past 24 hrs:  BP Temp Temp src Pulse Resp SpO2 Height Weight  03/19/16 1247 (!) 146/101 - - 85 - - - -  03/19/16 1232 148/92 - - 90 - - - -  03/19/16 1216 151/97 - - 91 - - - -  03/19/16 1201 145/97 - - 88 - - - -  03/19/16 1151 - - - - - - 5' 7.5" (1.715 m) 176 lb 12.8 oz (80.2 kg)  03/19/16 1147 143/96 - - 79 - 98 % - -  03/19/16 1141 158/96 98.6 F (37 C) Oral 80 18 98 % - -   Constitutional: Well-developed, well-nourished female in  no acute distress.  Cardiovascular: normal rate and rhythm Respiratory: normal effort, clear to auscultation bilaterally GI: Abd soft, non-tender, gravid appropriate for gestational age.   No rebound or guarding. MS: Extremities nontender, Trace edema, normal ROM Neurologic: Alert and oriented x 4. DTRs 3+ with one beat of clonus GU: Neg CVAT.  FHT:  Baseline 145 , moderate variability, accelerations present, no decelerations Contractions:  Rare   Labs: Lab Results Last 24 Hours       Results for orders placed or performed during the hospital encounter of 03/19/16 (from the past 24 hour(s))  Urinalysis, Routine w reflex microscopic (not [facility])     Status: Abnormal   Collection Time: 03/19/16 11:25 AM  Result Value Ref Range   Color, Urine YELLOW YELLOW   APPearance HAZY (A) CLEAR   Specific Gravity, Urine 1.015 1.005 - 1.030   pH 6.0 5.0 - 8.0   Glucose, UA NEGATIVE NEGATIVE mg/dL   Hgb urine dipstick TRACE (A) NEGATIVE   Bilirubin Urine NEGATIVE NEGATIVE   Ketones, ur NEGATIVE NEGATIVE mg/dL   Protein, ur 784100 (A) NEGATIVE mg/dL   Nitrite NEGATIVE NEGATIVE   Leukocytes, UA NEGATIVE NEGATIVE  Urine microscopic-add on     Status: Abnormal   Collection Time: 03/19/16 11:25 AM  Result Value Ref Range   Squamous Epithelial / LPF 6-30 (A) NONE SEEN   WBC, UA 0-5 0 - 5 WBC/hpf   RBC / HPF NONE SEEN 0 - 5 RBC/hpf   Bacteria, UA NONE SEEN NONE SEEN  Protein / creatinine ratio, urine     Status: Abnormal   Collection Time: 03/19/16 11:25 AM  Result Value Ref Range   Creatinine, Urine 112.00 mg/dL   Total Protein, Urine 97 mg/dL   Protein Creatinine Ratio 0.87 (H) 0.00 - 0.15 mg/mg[Cre]  CBC     Status: Abnormal   Collection Time: 03/19/16  1:27 PM  Result Value Ref Range   WBC 14.2 (H) 4.0 - 10.5 K/uL   RBC 3.81 (L) 3.87 - 5.11 MIL/uL   Hemoglobin 11.6 (L) 12.0 - 15.0 g/dL   HCT 69.632.9 (L) 29.536.0 - 28.446.0 %   MCV 86.4 78.0 - 100.0 fL   MCH 30.4 26.0 -  34.0 pg   MCHC 35.3 30.0 - 36.0 g/dL   RDW 13.213.5 44.011.5 - 10.215.5 %   Platelets 211 150 - 400 K/uL  Comprehensive metabolic panel     Status: Abnormal   Collection Time: 03/19/16  1:27 PM  Result Value Ref Range   Sodium 136 135 - 145 mmol/L   Potassium 4.1 3.5 - 5.1 mmol/L   Chloride 107 101 - 111 mmol/L   CO2 21 (L) 22 - 32 mmol/L   Glucose, Bld 77  65 - 99 mg/dL   BUN 10 6 - 20 mg/dL   Creatinine, Ser 1.610.68 0.44 - 1.00 mg/dL   Calcium 8.5 (L) 8.9 - 10.3 mg/dL   Total Protein 6.8 6.5 - 8.1 g/dL   Albumin 3.0 (L) 3.5 - 5.0 g/dL   AST 26 15 - 41 U/L   ALT 23 14 - 54 U/L   Alkaline Phosphatase 82 38 - 126 U/L   Total Bilirubin 0.5 0.3 - 1.2 mg/dL   GFR calc non Af Amer >60 >60 mL/min   GFR calc Af Amer >60 >60 mL/min   Anion gap 8 5 - 15      Imaging:  Imaging Results  No results found.    MAU Course/MDM: I have ordered labs and reviewed results.  NST reviewed Consult Dr Su Hiltoberts with presentation, exam findings and test results.  Will admit for betamethasone and observation  Assessment: SIUP at 8555w1d Preeclampsia  Plan: Admit to Antenatal per DR Su Hiltoberts Routine antenatal orders Betamethasone series 24 hour urine MD to follow  Illene BolusLori Clemmons CNM 03/19/2016 @ 856pm  Will order u/s with MFM for the morning and repeat labs.

## 2016-03-20 ENCOUNTER — Inpatient Hospital Stay (HOSPITAL_COMMUNITY): Payer: Managed Care, Other (non HMO)

## 2016-03-20 DIAGNOSIS — O133 Gestational [pregnancy-induced] hypertension without significant proteinuria, third trimester: Secondary | ICD-10-CM | POA: Diagnosis not present

## 2016-03-20 LAB — CBC
HEMATOCRIT: 33.5 % — AB (ref 36.0–46.0)
HEMOGLOBIN: 11.8 g/dL — AB (ref 12.0–15.0)
MCH: 30.5 pg (ref 26.0–34.0)
MCHC: 35.2 g/dL (ref 30.0–36.0)
MCV: 86.6 fL (ref 78.0–100.0)
Platelets: 227 10*3/uL (ref 150–400)
RBC: 3.87 MIL/uL (ref 3.87–5.11)
RDW: 13.5 % (ref 11.5–15.5)
WBC: 19.3 10*3/uL — AB (ref 4.0–10.5)

## 2016-03-20 LAB — COMPREHENSIVE METABOLIC PANEL
ALBUMIN: 2.9 g/dL — AB (ref 3.5–5.0)
ALK PHOS: 87 U/L (ref 38–126)
ALT: 26 U/L (ref 14–54)
AST: 31 U/L (ref 15–41)
Anion gap: 6 (ref 5–15)
BILIRUBIN TOTAL: 0.4 mg/dL (ref 0.3–1.2)
BUN: 14 mg/dL (ref 6–20)
CALCIUM: 9.4 mg/dL (ref 8.9–10.3)
CO2: 21 mmol/L — ABNORMAL LOW (ref 22–32)
CREATININE: 0.8 mg/dL (ref 0.44–1.00)
Chloride: 107 mmol/L (ref 101–111)
GFR calc Af Amer: >60 mL/min (ref >60)
GFR calc non Af Amer: >60 mL/min (ref >60)
GLUCOSE: 162 mg/dL — AB (ref 65–99)
Potassium: 4.7 mmol/L (ref 3.5–5.1)
Sodium: 134 mmol/L — ABNORMAL LOW (ref 135–145)
TOTAL PROTEIN: 6.6 g/dL (ref 6.5–8.1)

## 2016-03-20 LAB — PROTEIN, URINE, 24 HOUR
Collection Interval-UPROT: 24 hours
Protein, 24H Urine: 1440 mg/d — ABNORMAL HIGH (ref 50–100)
Protein, Urine: 60 mg/dL
Urine Total Volume-UPROT: 2400 mL

## 2016-03-20 LAB — LACTATE DEHYDROGENASE: LDH: 161 U/L (ref 98–192)

## 2016-03-20 LAB — URIC ACID: Uric Acid, Serum: 6.2 mg/dL (ref 2.3–6.6)

## 2016-03-20 NOTE — Progress Notes (Signed)
Inpatient Diabetes Program Recommendations  Diabetes Treatment Program Recommendations  ADA Standards of Care 2016 Diabetes in Pregnancy Target Glucose Ranges:  Fasting: 60 - 90 mg/dL Preprandial: 60 - 130105 mg/dL 1 hr postprandial: Less than 140mg /dL (from first bite of meal) 2 hr postprandial: Less than 120 mg/dL (from first bite of meal)    Results for Charlotte Walton, Charlotte Walton (MRN [id number]) as of 03/20/2016 07:36  Ref. Range 03/19/2016 13:27 03/20/2016 06:17  Glucose Latest Ref Range: 65 - 99 mg/dL 77 295162 (H)    Review of Glycemic Control  Diabetes history: No Outpatient Diabetes medications: NA Current orders for Inpatient glycemic control: None  Inpatient Diabetes Program Recommendations: Correction (SSI): Noted patient received Betamethasone 12 mg on 03/19/16 and fasting lab glucose 162 mg/dl this morning. Patient will receive second dose of Betamethasone today. While inpatient, may want to use Diabetic Pregnant Patient order set to order CBGs with Novolog 0-14 units QID (fasting and 2 hour post prandial).  Thanks, Charlotte PennerMarie Paulette Lynch, RN, MSN, CDE Diabetes Coordinator Inpatient Diabetes Program [phone] (Team Pager from 8am to 5pm) [phone] ([facility] office) [phone] [facility] office) [phone] [facility] office)

## 2016-03-20 NOTE — Discharge Summary (Signed)
Physician Discharge Summary  Patient ID: Charlotte Walton MRN: [id number] DOB/AGE: 1991/02/22 25 y.o.  Admit date: 03/19/2016 Discharge date: 03/20/2016  Admission Diagnoses:  Discharge Diagnoses:  Active Problems:   Gestational hypertension   Preeclampsia--without severe features   Discharged Condition: stable  Hospital Course: Admitted 03/19/16 due to elevated BP.  PCR was 0.87, other PIH labs WNL.  Did not require any anti-hypertensives during hospitalization.  US showed EFW 2+11, 54%ile, AFI 15, anterior placenta.  FHR remained Category 1, with some mild irritability.  She received 2 doses betamethasone and completed a 24 hour urine collection.  Patient desired outpatient management.  Per consult with Dr. Alesia Richards, she was d/c'd home after completion of that collection.  PIH precautions were reviewed with the patient.    Outpatient f/u will include: Daily fetal kick counts -Growth ultrasound every 3 weeks -AFI every week with ROB visit until 32 weeks, then can begin weekly BPP's with ROB visits -None stress test every week with BP check.   -For glucose challenge test in 1 week at the office.   Consults: None  Significant Diagnostic Studies: labs:  Results for orders placed or performed during the hospital encounter of 03/19/16 (from the past 48 hour(s))  Urinalysis, Routine w reflex microscopic (not at [facility])     Status: Abnormal   Collection Time: 03/19/16 11:25 AM  Result Value Ref Range   Color, Urine YELLOW YELLOW   APPearance HAZY (A) CLEAR   Specific Gravity, Urine 1.015 1.005 - 1.030   pH 6.0 5.0 - 8.0   Glucose, UA NEGATIVE NEGATIVE mg/dL   Hgb urine dipstick TRACE (A) NEGATIVE   Bilirubin Urine NEGATIVE NEGATIVE   Ketones, ur NEGATIVE NEGATIVE mg/dL   Protein, ur 100 (A) NEGATIVE mg/dL   Nitrite NEGATIVE NEGATIVE   Leukocytes, UA NEGATIVE NEGATIVE  Urine microscopic-add on     Status: Abnormal   Collection Time: 03/19/16 11:25 AM  Result Value Ref Range   Squamous  Epithelial / LPF 6-30 (A) NONE SEEN   WBC, UA 0-5 0 - 5 WBC/hpf   RBC / HPF NONE SEEN 0 - 5 RBC/hpf   Bacteria, UA NONE SEEN NONE SEEN  Protein / creatinine ratio, urine     Status: Abnormal   Collection Time: 03/19/16 11:25 AM  Result Value Ref Range   Creatinine, Urine 112.00 mg/dL   Total Protein, Urine 97 mg/dL    Comment: NO NORMAL RANGE ESTABLISHED FOR THIS TEST   Protein Creatinine Ratio 0.87 (H) 0.00 - 0.15 mg/mg[Cre]  CBC     Status: Abnormal   Collection Time: 03/19/16  1:27 PM  Result Value Ref Range   WBC 14.2 (H) 4.0 - 10.5 K/uL   RBC 3.81 (L) 3.87 - 5.11 MIL/uL   Hemoglobin 11.6 (L) 12.0 - 15.0 g/dL   HCT 32.9 (L) 36.0 - 46.0 %   MCV 86.4 78.0 - 100.0 fL   MCH 30.4 26.0 - 34.0 pg   MCHC 35.3 30.0 - 36.0 g/dL   RDW 13.5 11.5 - 15.5 %   Platelets 211 150 - 400 K/uL  Comprehensive metabolic panel     Status: Abnormal   Collection Time: 03/19/16  1:27 PM  Result Value Ref Range   Sodium 136 135 - 145 mmol/L   Potassium 4.1 3.5 - 5.1 mmol/L   Chloride 107 101 - 111 mmol/L   CO2 21 (L) 22 - 32 mmol/L   Glucose, Bld 77 65 - 99 mg/dL   BUN 10 6 - 20  mg/dL   Creatinine, Ser 0.68 0.44 - 1.00 mg/dL   Calcium 8.5 (L) 8.9 - 10.3 mg/dL   Total Protein 6.8 6.5 - 8.1 g/dL   Albumin 3.0 (L) 3.5 - 5.0 g/dL   AST 26 15 - 41 U/L   ALT 23 14 - 54 U/L   Alkaline Phosphatase 82 38 - 126 U/L   Total Bilirubin 0.5 0.3 - 1.2 mg/dL   GFR calc non Af Amer >60 >60 mL/min   GFR calc Af Amer >60 >60 mL/min    Comment: (NOTE) The eGFR has been calculated using the CKD EPI equation. This calculation has not been validated in all clinical situations. eGFR's persistently <60 mL/min signify possible Chronic Kidney Disease.    Anion gap 8 5 - 15  Type and screen [place]     Status: None   Collection Time: 03/19/16  7:31 PM  Result Value Ref Range   ABO/RH(D) AB POS    Antibody Screen NEG    Sample Expiration 03/22/2016   ABO/Rh     Status: None   Collection  Time: 03/19/16  7:31 PM  Result Value Ref Range   ABO/RH(D) AB POS   CBC     Status: Abnormal   Collection Time: 03/20/16  6:17 AM  Result Value Ref Range   WBC 19.3 (H) 4.0 - 10.5 K/uL   RBC 3.87 3.87 - 5.11 MIL/uL   Hemoglobin 11.8 (L) 12.0 - 15.0 g/dL   HCT 33.5 (L) 36.0 - 46.0 %   MCV 86.6 78.0 - 100.0 fL   MCH 30.5 26.0 - 34.0 pg   MCHC 35.2 30.0 - 36.0 g/dL   RDW 13.5 11.5 - 15.5 %   Platelets 227 150 - 400 K/uL  Comprehensive metabolic panel     Status: Abnormal   Collection Time: 03/20/16  6:17 AM  Result Value Ref Range   Sodium 134 (L) 135 - 145 mmol/L   Potassium 4.7 3.5 - 5.1 mmol/L   Chloride 107 101 - 111 mmol/L   CO2 21 (L) 22 - 32 mmol/L   Glucose, Bld 162 (H) 65 - 99 mg/dL   BUN 14 6 - 20 mg/dL   Creatinine, Ser 0.80 0.44 - 1.00 mg/dL   Calcium 9.4 8.9 - 10.3 mg/dL   Total Protein 6.6 6.5 - 8.1 g/dL   Albumin 2.9 (L) 3.5 - 5.0 g/dL   AST 31 15 - 41 U/L   ALT 26 14 - 54 U/L   Alkaline Phosphatase 87 38 - 126 U/L   Total Bilirubin 0.4 0.3 - 1.2 mg/dL   GFR calc non Af Amer >60 >60 mL/min   GFR calc Af Amer >60 >60 mL/min    Comment: (NOTE) The eGFR has been calculated using the CKD EPI equation. This calculation has not been validated in all clinical situations. eGFR's persistently <60 mL/min signify possible Chronic Kidney Disease.    Anion gap 6 5 - 15  Uric acid     Status: None   Collection Time: 03/20/16  6:17 AM  Result Value Ref Range   Uric Acid, Serum 6.2 2.3 - 6.6 mg/dL  Lactate dehydrogenase     Status: None   Collection Time: 03/20/16  6:17 AM  Result Value Ref Range   LDH 161 98 - 192 U/L    Treatments: steroids: Betamethasone course  Discharge Exam: Blood pressure (!) 144/94, pulse 74, temperature 98.8 F (37.1 C), temperature source Oral, resp. rate 18, height 5' 5.5" (  1.664 m), weight 79.4 kg (175 lb), last menstrual period 07/14/2015, SpO2 98 %.   Vitals:   03/20/16 1228 03/20/16 1229 03/20/16 1718 03/20/16 1923  BP:  (!)  146/76 (!) 139/93 (!) 144/94  Pulse:  85 80 74  Resp: 20  20 18   Temp: 98.3 F (36.8 C)   98.8 F (37.1 C)  TempSrc:    Oral  SpO2:      Weight:      Height:       General appearance: alert Resp: clear to auscultation bilaterally Cardio: regular rate and rhythm, S1, S2 normal, no murmur, click, rub or gallop Pelvic: uterus normal size, shape, and consistency Extremities: extremities normal, atraumatic, no cyanosis or edema  FHR Category 1  Disposition: D/C'd home     Medication List    STOP taking these medications   fluconazole 100 MG tablet Commonly known as:  DIFLUCAN   HYLAFEM Supp   metroNIDAZOLE 500 MG tablet Commonly known as:  FLAGYL   nitrofurantoin (macrocrystal-monohydrate) 100 MG capsule Commonly known as:  MACROBID   terconazole 0.4 % vaginal cream Commonly known as:  TERAZOL 7     TAKE these medications   acetaminophen 325 MG tablet Commonly known as:  TYLENOL Take 650 mg by mouth every 6 (six) hours as needed.   prenatal multivitamin Tabs tablet Take 1 tablet by mouth daily at 12 noon.      Follow-up [facility]. Schedule an appointment as soon as possible for a visit in 1 week(s).   Specialty:  Obstetrics and Gynecology Why:  Office will call you to schedule visits twice a week--one visit will have baby monitoring, one visit will have an ultrasound and a visit with an MD or midwife.  Call for ANY questions or concerns. Contact information: [address] [place] [address] [phone]          Signed: Donnel Saxon 03/20/2016, 7:50 PM

## 2016-03-20 NOTE — Discharge Instructions (Signed)

## 2016-03-20 NOTE — Progress Notes (Addendum)
Charlotte Walton, Charlotte Walton Female, 25 y.o., 1990/09/13 P0 @ 28 W 1 day EGA with preeclampsia without severe features   Subjective: Patient reports no complaints.  Denies headache, scotomata, blurry vision, chest pain or shortness of breath. Denies abdominal pain, nausea or vomiting.      Objective: I have reviewed patient's vital signs, intake and output, medications, labs and radiology results. Vitals:   03/19/16 1800 03/19/16 1815 03/19/16 2107 03/20/16 0633  BP: 135/90 (!) 151/89 (!) 141/90 131/86  Pulse: 75 86 85 74  Resp:  16 20   Temp:  98.5 F (36.9 C) 98.1 F (36.7 C)   TempSrc:  Oral Oral   SpO2:      Weight:  79.4 kg (175 lb)    Height:  5' 5.5" (1.664 m)     CBC    Component Value Date/Time   WBC 19.3 (H) 03/20/2016 0617   RBC 3.87 03/20/2016 0617   HGB 11.8 (L) 03/20/2016 0617   HCT 33.5 (L) 03/20/2016 0617   PLT 227 03/20/2016 0617   MCV 86.6 03/20/2016 0617   MCH 30.5 03/20/2016 0617   MCHC 35.2 03/20/2016 0617   RDW 13.5 03/20/2016 0617   LYMPHSABS 2.6 03/14/2015 1644   MONOABS 0.8 03/14/2015 1644   EOSABS 0.4 03/14/2015 1644   BASOSABS 0.0 03/14/2015 1644   CMP     Component Value Date/Time   NA 134 (L) 03/20/2016 0617   K 4.7 03/20/2016 0617   CL 107 03/20/2016 0617   CO2 21 (L) 03/20/2016 0617   GLUCOSE 162 (H) 03/20/2016 0617   BUN 14 03/20/2016 0617   CREATININE 0.80 03/20/2016 0617   CREATININE 0.94 03/14/2015 1644   CALCIUM 9.4 03/20/2016 0617   PROT 6.6 03/20/2016 0617   ALBUMIN 2.9 (L) 03/20/2016 0617   AST 31 03/20/2016 0617   ALT 26 03/20/2016 0617   ALKPHOS 87 03/20/2016 0617   BILITOT 0.4 03/20/2016 0617   GFRNONAA >60 03/20/2016 0617   GFRAA >60 03/20/2016 0617   8/24: Uric acid 6.2.  LDH 161.    EFM 8/23 @ 11pm: 130 BL, mod variability, reactive.  TOCO: + irritabilty, no distinct contractions.   Ultrasound 8/24: EFW 2lbs 11 oz (54th%) Cephalic. AFI 15cm. Anterior Placenta.    General: alert and cooperative Resp: clear to  auscultation bilaterally Cardio: regular rate and rhythm, S1, S2 normal, no murmur, click, rub or gallop GI: soft, non-tender; bowel sounds normal; no masses,  no organomegaly Extremities: extremities normal, atraumatic, no cyanosis or edema and no edema, redness or tenderness in the calves or thighs   Assessment/Plan: 25 y/o P0 @ 3228 W 1 day EGA with preeclampsia without severe features, -c/w 24 hour urine collection for total protein, to be finished at 9pm tonight  -For betamethasone # 2 at 1830 today -C/w close monitoring for signs and symptoms of preeclampsia. -If remains stable and without severe features, after second betamethasone and 24 hr urine collection, would manage as outpatient with:  -Daily fetal kick counts -Growth ultrasound every 3 weeks -AFI every week with ROB visit until 32 weeks, then can begin weekly BPP's with ROB visits -None stress test every week with BP check.   -For glucose challenge test in 1 week at the office.     LOS: 1 day    Konrad FelixKULWA,Eline Geng WAKURU, MD 03/20/2016, 7:34 AM

## 2016-03-22 ENCOUNTER — Inpatient Hospital Stay (HOSPITAL_COMMUNITY)
Admission: AD | Admit: 2016-03-22 | Discharge: 2016-04-12 | DRG: 774 | Disposition: A | Discharge status: Home / Self Care | Payer: Managed Care, Other (non HMO) | Source: Ambulatory Visit | Attending: Obstetrics and Gynecology | Admitting: Obstetrics and Gynecology

## 2016-03-22 DIAGNOSIS — O1410 Severe pre-eclampsia, unspecified trimester: Secondary | ICD-10-CM | POA: Diagnosis present

## 2016-03-22 DIAGNOSIS — O1413 Severe pre-eclampsia, third trimester: Principal | ICD-10-CM | POA: Diagnosis present

## 2016-03-22 DIAGNOSIS — Z6831 Body mass index (BMI) 31.0-31.9, adult: Secondary | ICD-10-CM

## 2016-03-22 DIAGNOSIS — Z8742 Personal history of other diseases of the female genital tract: Secondary | ICD-10-CM

## 2016-03-22 DIAGNOSIS — Z823 Family history of stroke: Secondary | ICD-10-CM

## 2016-03-22 DIAGNOSIS — O1415 Severe pre-eclampsia, complicating the puerperium: Secondary | ICD-10-CM | POA: Diagnosis present

## 2016-03-22 DIAGNOSIS — Z3A28 28 weeks gestation of pregnancy: Secondary | ICD-10-CM

## 2016-03-22 DIAGNOSIS — Z8249 Family history of ischemic heart disease and other diseases of the circulatory system: Secondary | ICD-10-CM

## 2016-03-23 ENCOUNTER — Encounter (HOSPITAL_COMMUNITY): Payer: Self-pay | Admitting: *Deleted

## 2016-03-23 DIAGNOSIS — O1413 Severe pre-eclampsia, third trimester: Secondary | ICD-10-CM | POA: Diagnosis present

## 2016-03-23 DIAGNOSIS — Z823 Family history of stroke: Secondary | ICD-10-CM | POA: Diagnosis not present

## 2016-03-23 DIAGNOSIS — O1410 Severe pre-eclampsia, unspecified trimester: Secondary | ICD-10-CM | POA: Diagnosis present

## 2016-03-23 DIAGNOSIS — Z8742 Personal history of other diseases of the female genital tract: Secondary | ICD-10-CM

## 2016-03-23 DIAGNOSIS — Z8249 Family history of ischemic heart disease and other diseases of the circulatory system: Secondary | ICD-10-CM | POA: Diagnosis not present

## 2016-03-23 DIAGNOSIS — Z3A28 28 weeks gestation of pregnancy: Secondary | ICD-10-CM | POA: Diagnosis not present

## 2016-03-23 DIAGNOSIS — R030 Elevated blood-pressure reading, without diagnosis of hypertension: Secondary | ICD-10-CM | POA: Diagnosis present

## 2016-03-23 DIAGNOSIS — Z6831 Body mass index (BMI) 31.0-31.9, adult: Secondary | ICD-10-CM | POA: Diagnosis not present

## 2016-03-23 LAB — COMPREHENSIVE METABOLIC PANEL
ALK PHOS: 87 U/L (ref 38–126)
ALT: 25 U/L (ref 14–54)
AST: 29 U/L (ref 15–41)
Albumin: 3 g/dL — ABNORMAL LOW (ref 3.5–5.0)
Anion gap: 7 (ref 5–15)
BUN: 18 mg/dL (ref 6–20)
CALCIUM: 8.7 mg/dL — AB (ref 8.9–10.3)
CHLORIDE: 106 mmol/L (ref 101–111)
CO2: 21 mmol/L — AB (ref 22–32)
CREATININE: 0.7 mg/dL (ref 0.44–1.00)
GFR calc Af Amer: >60 mL/min (ref >60)
Glucose, Bld: 104 mg/dL — ABNORMAL HIGH (ref 65–99)
Potassium: 4.1 mmol/L (ref 3.5–5.1)
SODIUM: 134 mmol/L — AB (ref 135–145)
Total Bilirubin: 0.5 mg/dL (ref 0.3–1.2)
Total Protein: 6.4 g/dL — ABNORMAL LOW (ref 6.5–8.1)

## 2016-03-23 LAB — CBC
HEMATOCRIT: 31.7 % — AB (ref 36.0–46.0)
HEMOGLOBIN: 11.2 g/dL — AB (ref 12.0–15.0)
MCH: 31.6 pg (ref 26.0–34.0)
MCHC: 35.3 g/dL (ref 30.0–36.0)
MCV: 89.5 fL (ref 78.0–100.0)
PLATELETS: 218 10*3/uL (ref 150–400)
RBC: 3.54 MIL/uL — ABNORMAL LOW (ref 3.87–5.11)
RDW: 13.3 % (ref 11.5–15.5)
WBC: 16.1 10*3/uL — ABNORMAL HIGH (ref 4.0–10.5)

## 2016-03-23 LAB — OB RESULTS CONSOLE GBS: GBS: NEGATIVE

## 2016-03-23 LAB — PROTEIN / CREATININE RATIO, URINE
Creatinine, Urine: 79 mg/dL
PROTEIN CREATININE RATIO: 3.15 mg/mg{creat} — AB (ref 0.00–0.15)
Total Protein, Urine: 249 mg/dL

## 2016-03-23 LAB — URINALYSIS, ROUTINE W REFLEX MICROSCOPIC
Bilirubin Urine: NEGATIVE
GLUCOSE, UA: NEGATIVE mg/dL
KETONES UR: NEGATIVE mg/dL
LEUKOCYTES UA: NEGATIVE
Nitrite: NEGATIVE
PH: 6.5 (ref 5.0–8.0)
Protein, ur: 100 mg/dL — AB
Specific Gravity, Urine: 1.025 (ref 1.005–1.030)

## 2016-03-23 LAB — URINE MICROSCOPIC-ADD ON

## 2016-03-23 LAB — TYPE AND SCREEN
ABO/RH(D): AB POS
Antibody Screen: NEGATIVE

## 2016-03-23 LAB — LACTATE DEHYDROGENASE: LDH: 168 U/L (ref 98–192)

## 2016-03-23 LAB — FETAL FIBRONECTIN: FETAL FIBRONECTIN: NEGATIVE

## 2016-03-23 LAB — URIC ACID: Uric Acid, Serum: 5.3 mg/dL (ref 2.3–6.6)

## 2016-03-23 LAB — GROUP B STREP BY PCR: GROUP B STREP BY PCR: NEGATIVE

## 2016-03-23 MED ORDER — LABETALOL HCL 5 MG/ML IV SOLN
20.0000 mg | INTRAVENOUS | Status: AC | PRN
  Administered 2016-04-01 – 2016-04-05 (×3): 20 mg via INTRAVENOUS
  Filled 2016-03-23: qty 8
  Filled 2016-03-23 (×3): qty 4

## 2016-03-23 MED ORDER — PRENATAL MULTIVITAMIN CH
1.0000 | ORAL_TABLET | Freq: Every day | ORAL | Status: DC
  Administered 2016-03-23 – 2016-04-04 (×13): 1 via ORAL
  Filled 2016-03-23 (×15): qty 1

## 2016-03-23 MED ORDER — ZOLPIDEM TARTRATE 5 MG PO TABS: 5.0000 mg | ORAL_TABLET | Freq: Every evening | ORAL | Status: DC | PRN

## 2016-03-23 MED ORDER — HYDRALAZINE HCL 20 MG/ML IJ SOLN
10.0000 mg | Freq: Once | INTRAMUSCULAR | Status: AC | PRN
  Administered 2016-04-06: 10 mg via INTRAVENOUS
  Filled 2016-03-23 (×2): qty 1

## 2016-03-23 MED ORDER — ACETAMINOPHEN 325 MG PO TABS
650.0000 mg | ORAL_TABLET | ORAL | Status: DC | PRN
  Administered 2016-03-25 – 2016-04-06 (×3): 650 mg via ORAL
  Filled 2016-03-23 (×4): qty 2

## 2016-03-23 MED ORDER — LACTATED RINGERS IV SOLN
INTRAVENOUS | Status: DC
  Administered 2016-03-23 – 2016-04-08 (×11): via INTRAVENOUS

## 2016-03-23 MED ORDER — CALCIUM CARBONATE ANTACID 500 MG PO CHEW: 2.0000 | CHEWABLE_TABLET | ORAL | Status: DC | PRN

## 2016-03-23 MED ORDER — DOCUSATE SODIUM 100 MG PO CAPS
100.0000 mg | ORAL_CAPSULE | Freq: Every day | ORAL | Status: DC
  Administered 2016-03-24 – 2016-04-05 (×13): 100 mg via ORAL
  Filled 2016-03-23 (×15): qty 1

## 2016-03-23 NOTE — Progress Notes (Addendum)
Hospital day # 1, pregnancy at [redacted]w[redacted]d-preE w/ severe features.  S: Denies h/a, visual changes, epigastric pain, CP, difficulty breathing, fevers, chills or N/V.      Perception of contractions: none.      Vaginal bleeding: none.       Vaginal discharge: no significant change.  States feeling well. Tolerating a regular diet.  O: BP (!) 152/96 (BP Location: Left Arm)   Pulse 78   Temp 97.8 F (36.6 C) (Oral)   Resp 16   Ht 5' 5.5" (1.664 m)   Wt 81.2 kg (179 lb)   LMP 07/14/2015 (Exact Date)   BMI 29.33 kg/m   Today's Vitals   03/23/16 1846 03/23/16 1955 03/23/16 2317 03/24/16 0443  BP:  (!) 157/98 (!) 149/103 (!) 152/96  Pulse:  83 80 78  Resp:  16 16 16   Temp:  98.7 F (37.1 C) 98.4 F (36.9 C) 97.8 F (36.6 C)  TempSrc:  Oral Oral Oral  Weight:      Height:      PainSc: 0-No pain 0-No pain 0-No pain 0-No pain       Gen: NAD      Lungs: CTAB      CV: RRR w/o M/R/G      Fetal tracings: BL 148 w/ moderate variability, +accels, no decels      Contractions: Mild, occasional uterine irritability      Uterus gravid, consistent with 28 weeks and non-tender      Extremities: extremities normal, atraumatic, no cyanosis or edema and no significant edema and no signs of DVT   BP range since admission = 126-157/82-105, and has not required IV antihypertensives thus far.          Labs:   Results for orders placed or performed during the hospital encounter of 03/22/16 (from the past 48 hour(s))  [facility], [facility]     Status: Abnormal   Collection Time: 03/22/16 11:55 PM  Result Value Ref Range   Color, Urine YELLOW YELLOW   APPearance CLOUDY (A) CLEAR   Specific Gravity, Urine 1.025 1.005 - 1.030   pH 6.5 5.0 - 8.0   Glucose, UA NEGATIVE NEGATIVE mg/dL   Hgb urine dipstick TRACE (A) NEGATIVE   Bilirubin Urine NEGATIVE NEGATIVE   Ketones, ur NEGATIVE NEGATIVE mg/dL   Protein, ur 100 (A) NEGATIVE mg/dL   Nitrite NEGATIVE NEGATIVE   Leukocytes, UA NEGATIVE NEGATIVE  Protein / creatinine ratio, urine     Status: Abnormal   Collection Time: 03/22/16 11:55 PM  Result Value Ref Range   Creatinine, Urine 79.00 mg/dL   Total Protein, Urine 249 mg/dL    Comment: NO NORMAL RANGE ESTABLISHED FOR THIS TEST RESULTS CONFIRMED BY MANUAL DILUTION    Protein Creatinine Ratio 3.15 (H) 0.00 - 0.15 mg/mg[Cre]  Urine microscopic-add on     Status: Abnormal   Collection Time: 03/22/16 11:55 PM  Result Value Ref Range   Squamous Epithelial / LPF 6-30 (A) NONE SEEN   WBC, UA 0-5 0 - 5 WBC/hpf   RBC / HPF 0-5 0 - 5 RBC/hpf   Bacteria, UA FEW (A) NONE SEEN  CBC     Status: Abnormal   Collection Time: 03/23/16 12:20 AM  Result Value Ref Range   WBC 16.1 (H) 4.0 - 10.5 K/uL   RBC 3.54 (L) 3.87 - 5.11 MIL/uL   Hemoglobin 11.2 (L) 12.0 - 15.0 g/dL   HCT 31.7 (L) 36.0 - 46.0 %  MCV 89.5 78.0 - 100.0 fL   MCH 31.6 26.0 - 34.0 pg   MCHC 35.3 30.0 - 36.0 g/dL   RDW 13.3 11.5 - 15.5 %   Platelets 218 150 - 400 K/uL  Comprehensive metabolic panel     Status: Abnormal   Collection Time: 03/23/16 12:20 AM  Result Value Ref Range   Sodium 134 (L) 135 - 145 mmol/L   Potassium 4.1 3.5 - 5.1 mmol/L   Chloride 106 101 - 111 mmol/L   CO2 21 (L) 22 - 32 mmol/L   Glucose, Bld 104 (H) 65 - 99 mg/dL   BUN 18 6 - 20 mg/dL   Creatinine, Ser 0.70 0.44 - 1.00 mg/dL   Calcium 8.7 (L) 8.9 - 10.3 mg/dL   Total Protein 6.4 (L) 6.5 - 8.1 g/dL   Albumin 3.0 (L) 3.5 - 5.0 g/dL   AST 29 15 - 41 U/L   ALT 25 14 - 54 U/L   Alkaline Phosphatase 87 38 - 126 U/L   Total Bilirubin 0.5 0.3 - 1.2 mg/dL   GFR calc non Af Amer >60 >60 mL/min   GFR calc Af Amer >60 >60 mL/min    Comment: (NOTE) The eGFR has been calculated using the CKD EPI equation. This calculation has not been validated in all clinical situations. eGFR's persistently <60 mL/min signify possible Chronic Kidney Disease.    Anion gap 7 5 - 15  Lactate dehydrogenase     Status: None    Collection Time: 03/23/16 12:20 AM  Result Value Ref Range   LDH 168 98 - 192 U/L  Uric acid     Status: None   Collection Time: 03/23/16 12:20 AM  Result Value Ref Range   Uric Acid, Serum 5.3 2.3 - 6.6 mg/dL  Type and screen [place]     Status: None   Collection Time: 03/23/16 12:20 AM  Result Value Ref Range   ABO/RH(D) AB POS    Antibody Screen NEG    Sample Expiration 03/26/2016   Group B strep by PCR     Status: None   Collection Time: 03/23/16 12:47 AM  Result Value Ref Range   Group B strep by PCR NEGATIVE NEGATIVE  Fetal fibronectin     Status: None   Collection Time: 03/23/16 12:47 AM  Result Value Ref Range   Fetal Fibronectin NEGATIVE NEGATIVE         Meds: Scheduled:  . docusate sodium  100 mg Oral Daily  . prenatal multivitamin  1 tablet Oral Q1200   . lactated ringers 125 mL/hr at 03/23/16 2200              Meds: PRN: acetaminophen, calcium carbonate, hydrALAZINE, labetalol, zolpidem  A: 36w6dadmitted on 03/23/16, second admission, now w/ preE w/ severe features     Steroid complete as of 03/21/16. White count (16.1 on 03/23/16, down from 19.3 on 03/20/16). Tmax 98.9.   P: Continue current plan of care      Upcoming tests/treatments: MFM consult, NST q shift, 1hr glucola.     WFarrel GordonCNM 03/24/2016 7:08 AM

## 2016-03-23 NOTE — Progress Notes (Signed)
Charlotte Walton, Charlotte Walton Female, 25 y.o., 1990/08/31  G1P0 @ 28W 5 D EGA with preeclampsia with severe features (severe range Bps)   Subjective: Patient reports no complaints.  She denies headache/scotomata/blurry vision/chest pain/shortness of breath/nausea/vomiting/abdominal pain/contractions/vaginal bleeding/leakage of fluid.  With normal gross fetal movement.    Objective: Vitals:   03/23/16 1309 03/23/16 1405 03/23/16 1628 03/23/16 1955  BP: 138/90 137/89 (!) 140/98 (!) 157/98  Pulse:  (!) 102 92 83  Resp: 16 16 16 16   Temp: 98.5 F (36.9 C)  98.2 F (36.8 C) 98.7 F (37.1 C)  TempSrc:    Oral  Weight:      Height:        General: alert and cooperative Resp: clear to auscultation bilaterally Cardio: regular rate and rhythm, S1, S2 normal, no murmur, click, rub or gallop GI: soft, non-tender; bowel sounds normal; no masses,  no organomegaly Extremities: extremities normal, atraumatic, no cyanosis or edema EFM: Cat 1 TOCO: No contractions.  CVX: deferred  Assessment/Plan:  25 y/o G1P0 @ 28W 5 D EGA with preeclampsia with severe features (severe range Bps), stable maternal and fetal status,  -C/w inpatient management until delivery  -MFM consult on Monday -s/p recent Betamethasone -NST Q shift - For Glucose challenge test this week.   LOS: 0 days   Konrad FelixKULWA,Charlotte Sutliff WAKURU, MD.  03/23/2016, 10:49 AM

## 2016-03-23 NOTE — Progress Notes (Signed)
Now on Antenatal Unit.  Vitals:   03/23/16 0112 03/23/16 0115 03/23/16 0135 03/23/16 0200  BP:  (!) 147/95 140/87 (!) 136/94  Pulse:  69 72 68  Resp: 18 18 18 18   Temp:  98.9 F (37.2 C)    TempSrc:  Oral    Weight: 81.2 kg (179 lb)     Height: 5' 5.5" (1.664 m)      Results for orders placed or performed during the hospital encounter of 03/22/16 (from the past 24 hour(s))  Urinalysis, Routine w reflex microscopic (not at [facility])     Status: Abnormal   Collection Time: 03/22/16 11:55 PM  Result Value Ref Range   Color, Urine YELLOW YELLOW   APPearance CLOUDY (A) CLEAR   Specific Gravity, Urine 1.025 1.005 - 1.030   pH 6.5 5.0 - 8.0   Glucose, UA NEGATIVE NEGATIVE mg/dL   Hgb urine dipstick TRACE (A) NEGATIVE   Bilirubin Urine NEGATIVE NEGATIVE   Ketones, ur NEGATIVE NEGATIVE mg/dL   Protein, ur 161100 (A) NEGATIVE mg/dL   Nitrite NEGATIVE NEGATIVE   Leukocytes, UA NEGATIVE NEGATIVE  Protein / creatinine ratio, urine     Status: Abnormal   Collection Time: 03/22/16 11:55 PM  Result Value Ref Range   Creatinine, Urine 79.00 mg/dL   Total Protein, Urine 249 mg/dL   Protein Creatinine Ratio 3.15 (H) 0.00 - 0.15 mg/mg[Cre]  Urine microscopic-add on     Status: Abnormal   Collection Time: 03/22/16 11:55 PM  Result Value Ref Range   Squamous Epithelial / LPF 6-30 (A) NONE SEEN   WBC, UA 0-5 0 - 5 WBC/hpf   RBC / HPF 0-5 0 - 5 RBC/hpf   Bacteria, UA FEW (A) NONE SEEN  CBC     Status: Abnormal   Collection Time: 03/23/16 12:20 AM  Result Value Ref Range   WBC 16.1 (H) 4.0 - 10.5 K/uL   RBC 3.54 (L) 3.87 - 5.11 MIL/uL   Hemoglobin 11.2 (L) 12.0 - 15.0 g/dL   HCT 09.631.7 (L) 04.536.0 - 40.946.0 %   MCV 89.5 78.0 - 100.0 fL   MCH 31.6 26.0 - 34.0 pg   MCHC 35.3 30.0 - 36.0 g/dL   RDW 81.113.3 91.411.5 - 78.215.5 %   Platelets 218 150 - 400 K/uL  Comprehensive metabolic panel     Status: Abnormal   Collection Time: 03/23/16 12:20 AM  Result Value Ref Range   Sodium 134 (L) 135 - 145 mmol/L   Potassium 4.1 3.5 - 5.1 mmol/L   Chloride 106 101 - 111 mmol/L   CO2 21 (L) 22 - 32 mmol/L   Glucose, Bld 104 (H) 65 - 99 mg/dL   BUN 18 6 - 20 mg/dL   Creatinine, Ser 9.560.70 0.44 - 1.00 mg/dL   Calcium 8.7 (L) 8.9 - 10.3 mg/dL   Total Protein 6.4 (L) 6.5 - 8.1 g/dL   Albumin 3.0 (L) 3.5 - 5.0 g/dL   AST 29 15 - 41 U/L   ALT 25 14 - 54 U/L   Alkaline Phosphatase 87 38 - 126 U/L   Total Bilirubin 0.5 0.3 - 1.2 mg/dL   GFR calc non Af Amer >60 >60 mL/min   GFR calc Af Amer >60 >60 mL/min   Anion gap 7 5 - 15  Lactate dehydrogenase     Status: None   Collection Time: 03/23/16 12:20 AM  Result Value Ref Range   LDH 168 98 - 192 U/L  Uric acid  Status: None   Collection Time: 03/23/16 12:20 AM  Result Value Ref Range   Uric Acid, Serum 5.3 2.3 - 6.6 mg/dL  Type and screen [facility]      Status: None   Collection Time: 03/23/16 12:20 AM  Result Value Ref Range   ABO/RH(D) AB POS    Antibody Screen NEG    Sample Expiration 03/26/2016   Group B strep by PCR     Status: None   Collection Time: 03/23/16 12:47 AM  Result Value Ref Range   Group B strep by PCR NEGATIVE NEGATIVE  Fetal fibronectin     Status: None   Collection Time: 03/23/16 12:47 AM  Result Value Ref Range   Fetal Fibronectin NEGATIVE NEGATIVE   Will continue to observe. Reviewed possibility of prolonged hospitalization with patient and family.  Nigel Bridgeman, CNM 03/23/16 2:25a

## 2016-03-23 NOTE — H&P (Signed)
Charlotte Walton is a 25 y.o. female, G1P0 at 1228 5/7 weeks, presenting with elevated BP tonight--d/c'd home from Antenatal Dept on 8/25 after 24 hours observation and dx of pre-eclampsia without severe features.    Admitted 03/19/16 due to elevated BP.  PCR was 0.87, other PIH labs WNL.  Did not require any anti-hypertensives during hospitalization.  US showed EFW 2+11, 54%ile, AFI 15, anterior placenta.  FHR remained Category 1, with some mild irritability.  She received 2 doses betamethasone and completed a 24 hour urine collection.  Patient desired outpatient management.  Per consult with Dr. Sallye OberKulwa, she was d/c'd home after completion of that collection.  PIH precautions were reviewed with the patient.   24 hour urine protein resulted at 1440 mg/24 hours.  Patient had someone take her BP at home today, with result of 170/110, then went to local pharmacies x 2, with similar results.  Denies HA or epigastric pain--noted single episode of ? blurry vision.  Reports +FM, occasional cramping.  Denies leaking or bleeding.  Patient Active Problem List   Diagnosis Date Noted  . Pre-eclampsia, severe, antepartum 03/23/2016  . History of irregular menstrual cycles 03/23/2016    History of present pregnancy: Patient entered care at 8 5/7 weeks by LMP.   EDC of 06/10/16 was established by US at 14 weeks due to irregular cycles.   Anatomy scan:  20 weeks, with limited anatomy and an anterior placenta.   Additional US evaluations:   24 2/7 weeks:  Breech, completion of anatomy, EFW 47.9%ile, cervix 3.72, normal fluid.   Significant prenatal events: Uncomplicated pregnancy until admission on 8/23 for elevated BP.   OB History    Gravida Para Term Preterm AB Living   1 0 0 0 0 0   SAB TAB Ectopic Multiple Live Births   0 0 0 0       Past Medical History:  Diagnosis Date  . Medical history non-contributory   . Pregnancy induced hypertension    Past Surgical History:  Procedure Laterality Date  . NO  PAST SURGERIES     Family History: family history includes Depression in her sister; Drug abuse in her maternal grandmother; Early death in her maternal grandmother; Hyperlipidemia in her mother; Hypertension in her maternal grandmother; Miscarriages / [place] in her sister; Stroke in her maternal grandmother.   Social History:  reports that she has never smoked. She has never used smokeless tobacco. She reports that she does not drink alcohol or use drugs.  Patient is Tree surgeonAfrican American, a Archivistcollege student, single, with FOB, Darrol AngelBlair Adams, involved and supportive.  Patient is currently accompanied by her mother and her partner's mother.   Prenatal Transfer Tool  Maternal Diabetes: Has not been tested yet. Genetic Screening: Declined Maternal Ultrasounds/Referrals: Normal Fetal Ultrasounds or other Referrals:  None Maternal Substance Abuse:  No Significant Maternal Medications:  None Significant Maternal Lab Results: Lab values include: Other: GBS by PCR pending from today's admission  TDAP 10/2015 Flu 06/28/15  ROS:  Elevated BP, cramping  No Known Allergies   Dilation: Closed Exam by:: VEmilee Hero. Khai Arrona, cnm Blood pressure 140/87, pulse 72, temperature 98.9 F (37.2 C), temperature source Oral, resp. rate 18, height 5' 5.5" (1.664 m), weight 81.2 kg (179 lb), last menstrual period 07/14/2015.  Chest clear Heart RRR without murmur Abd gravid, NT, FH 28 cm Pelvic: Cervix closed, 50%, vtx, -2 Ext: DTR 1+, no clonus, 1+ edema  FHR: Category 1 UCs:  Irregular, mild  Prenatal labs: ABO, Rh: --/--/  AB POS (08/27 0020) Antibody: NEG (08/27 0020) Rubella:  Immune RPR:  NR  HBsAg:   Neg HIV:   NR GBS:  Pending from today's admission Sickle cell/Hgb electrophoresis:  AA Pap:  02/26/15 WNL GC:  11/12/15 negative Chlamydia:  11/12/15 negative Genetic screenings:  Declined Glucola: Not done yet Other:   Hgb 12.8 at NOB, 11.8 on 03/20/16     Assessment/Plan: IUP at 28 5/7  weeks Pre-eclampsia, now with severe features S/p betamethasone course 8/23-8/24/17  Plan: Admit to Antenatal Unit per consult with Dr. Su Hilt IV anti-hypertensives per parameters PIH labs and PCR MFM consult on Monday for management and timing of delivery GBS by PCR FFN  1 hour GTT ordered for Monday, 8/128/17.  Molly Savarino CNM, [place] 03/23/2016, 1:54 AM

## 2016-03-24 MED ORDER — NIFEDIPINE ER OSMOTIC RELEASE 30 MG PO TB24
30.0000 mg | ORAL_TABLET | Freq: Every day | ORAL | Status: DC
  Administered 2016-03-24: 30 mg via ORAL
  Filled 2016-03-24: qty 1

## 2016-03-24 NOTE — Progress Notes (Signed)
Patient asleep at this time. Per MD order, vital signs not obtained at this time.  Will continue to monitor. Milon DikesKristina D Brindle Leyba, RN

## 2016-03-24 NOTE — Consult Note (Signed)
Maternal Fetal Medicine Consultation  Requesting Provider(s): Nigel Bridgeman, CNM  Reason for consultation: Preeclampsia with severe features at [redacted] weeks gestation  HPI: Charlotte Walton is a 25 yo G1P0, EDD 06/10/2016 currently at 28w 6d admitted due to preeclampsia with severe features.  She was initially admitted on 8/23 due to labile blood pressures and a PCR ratio of 0.87.  Subsequent 24-hr urine showed 1140 mg protein/24 hours.  Ultrasound at that time showed an appropriately grown fetus (54th %tile) and normal amniotic fluid volume.  She completed a course of betamethasone and was discharged home with close outpatient monitoring.  She subsequently had blood pressures at a local pharmacy of 170/110 which prompted her current admission.  Repeat PCR showed a ratio of 3.15.  Her preeclampsia labs were otherwise normal except for a uric acid of 6.2.  Blood pressures since admission have mostly been in the 150's/ high 90's.  She is not currently on any oral blood pressure medications.  The fetus is active.  She denies RUQ pain, headaches or visual changes.  OB History: OB History    Gravida Para Term Preterm AB Living   1 0 0 0 0 0   SAB TAB Ectopic Multiple Live Births   0 0 0 0        PMH:  Past Medical History:  Diagnosis Date  . Medical history non-contributory   . Pregnancy induced hypertension     PSH:  Past Surgical History:  Procedure Laterality Date  . NO PAST SURGERIES     Meds:  No current facility-administered medications on file prior to encounter.    Current Outpatient Prescriptions on File Prior to Encounter  Medication Sig Dispense Refill  . acetaminophen (TYLENOL) 325 MG tablet Take 650 mg by mouth every 6 (six) hours as needed for mild pain, moderate pain or headache.      Allergies: No Known Allergies   FH:  Family History  Problem Relation Age of Onset  . Hyperlipidemia Mother   . Miscarriages / Stillbirths Sister   . Depression Sister   . Early death  Maternal Grandmother   . Drug abuse Maternal Grandmother   . Hypertension Maternal Grandmother   . Stroke Maternal Grandmother    Soc:  Social History   Social History  . Marital status: Single    Spouse name: N/A  . Number of children: N/A  . Years of education: N/A   Occupational History  . Not on file.   Social History Main Topics  . Smoking status: Never Smoker  . Smokeless tobacco: Never Used  . Alcohol use No     Comment: occ  . Drug use: No  . Sexual activity: Yes    Birth control/ protection: None   Other Topics Concern  . Not on file   Social History Narrative  . No narrative on file    Review of Systems: no vaginal bleeding or cramping/contractions, no LOF, no nausea/vomiting. All other systems reviewed and are negative.  PE:   Vitals:   03/24/16 0443 03/24/16 0834  BP: (!) 152/96 (!) 157/95  Pulse: 78 76  Resp: 16 16  Temp: 97.8 F (36.6 C) 98.1 F (36.7 C)    Labs: CBC    Component Value Date/Time   WBC 16.1 (H) 03/23/2016 0020   RBC 3.54 (L) 03/23/2016 0020   HGB 11.2 (L) 03/23/2016 0020   HCT 31.7 (L) 03/23/2016 0020   PLT 218 03/23/2016 0020   MCV 89.5 03/23/2016 0020  MCH 31.6 03/23/2016 0020   MCHC 35.3 03/23/2016 0020   RDW 13.3 03/23/2016 0020   LYMPHSABS 2.6 03/14/2015 1644   MONOABS 0.8 03/14/2015 1644   EOSABS 0.4 03/14/2015 1644   BASOSABS 0.0 03/14/2015 1644   CMP Latest Ref Rng & Units 03/23/2016 03/20/2016 03/19/2016  Glucose 65 - 99 mg/dL 308(M104(H) 578(I162(H) 77  BUN 6 - 20 mg/dL 18 14 10   Creatinine 0.44 - 1.00 mg/dL 6.960.70 2.950.80 2.840.68  Sodium 135 - 145 mmol/L 134(L) 134(L) 136  Potassium 3.5 - 5.1 mmol/L 4.1 4.7 4.1  Chloride 101 - 111 mmol/L 106 107 107  CO2 22 - 32 mmol/L 21(L) 21(L) 21(L)  Calcium 8.9 - 10.3 mg/dL 1.3(K8.7(L) 9.4 4.4(W8.5(L)  Total Protein 6.5 - 8.1 g/dL 6.4(L) 6.6 6.8  Total Bilirubin 0.3 - 1.2 mg/dL 0.5 0.4 0.5  Alkaline Phos 38 - 126 U/L 87 87 82  AST 15 - 41 U/L 29 31 26   ALT 14 - 54 U/L 25 26 23     A/P: 1)  Single IUP at 28w 6d  2) Preeclampsia with severe features - Ms. Peddy meets criteria for preeclampsia with severe features by proteinuria and severe range blood pressures.  She is currently stable on bedrest and completed a course of betamethasone on 8/23 and 8/24.  We had a long discussion regarding anticipated course and likely need of preterm delivery  Recommendations: 1) Would start antihypertensive medications.  My first choice would be Procardia XL.  Would start out at 30 mg daily and titrate upward to achieve target blood pressures in the 140/90 range.  The maximum dose would be 120 mg total dose daily. 2) Recommend preeclampsia labs at least 2x weekly (Monday/Thursday or Tuesday/Friday) or more frequently if any significant clinical change occurs 3) At least daily fetal strips or more frequently as clinically indicated 4) Would start Magnesium sulfate and move toward delivery for: 1) LFTS > 2x normal 2) Creat > 1.2 3) Platelets < 100 4) Severe range blood pressures (SBP at or > 160 or DBP at or > 110) despite antihypertensive medications 5) Severe headache that dose not improve with medications 6) Concerns over fetal testing 5) Recommend serial ultrasounds for growth every 3-4 weeks 7) Delivery by [redacted] weeks gestation due to preeclampsia with severe  features if otherwise stable and not meeting criteria for earlier delivery 8) Would recommend NICU consultation due to likely preterm delivery   Thank you for the opportunity to be a part of the care of [facility]. Please contact our office if we can be of further assistance.   I spent approximately 30 minutes with this patient with over 50% of time spent in face-to-face counseling.  Alpha GulaPaul Flynn Gwyn, MD Maternal Fetal Medicine

## 2016-03-24 NOTE — Progress Notes (Signed)
25 y.o. year old female,at 9448w6d gestation. The patient is admitted for management of preeclampsia with severe features. She has received betamethasone. She is currently on Procardia XL 30. She had a maternal fetal medicine consult today. Findings are listed below.  SUBJECTIVE:  Doing well. The patient reports that she had a headache for "10 minutes" this morning. It quickly resolved without medication.  OBJECTIVE:  BP (!) 149/95   Pulse 96   Temp 98.1 F (36.7 C)   Resp 16   Ht 5' 5.5" (1.664 m)   Wt 179 lb (81.2 kg)   LMP 07/14/2015 (Exact Date)   BMI 29.33 kg/m   Fetal Heart Tones:  Stable. Category 1 for 28 weeks.  Contractions:          None  Stable physical exam. Reflexes normal.  Ultrasound today by MFM:  Single intrauterine gestation, vertex, normal fluid.  ASSESSMENT:  648w6d Weeks Pregnancy  Preeclampsia with severe features  Labile blood pressures  Started today on Procardia XL 30  PLAN:  Continue in hospital support. Please see recommendation from maternal fetal medicine listed below:  Recommendations: 1) Would start antihypertensive medications.  My first choice would be Procardia XL.  Would start out at 30 mg daily and titrate upward to achieve target blood pressures in the 140/90 range.  The maximum dose would be 120 mg total dose daily. 2) Recommend preeclampsia labs at least 2x weekly (Monday/Thursday or Tuesday/Friday) or more frequently if any significant clinical change occurs 3) At least daily fetal strips or more frequently as clinically indicated 4) Would start Magnesium sulfate and move toward delivery for: 1) LFTS > 2x normal 2) Creat > 1.2 3) Platelets < 100 4) Severe range blood pressures (SBP at or > 160 or DBP at or > 110) despite antihypertensive medications 5) Severe headache that dose not improve with medications 6) Concerns over fetal testing 5) Recommend serial ultrasounds for growth every 3-4 weeks 7) Delivery by [redacted] weeks gestation  due to preeclampsia with severe  features if otherwise stable and not meeting criteria for earlier delivery 8) Would recommend NICU consultation due to likely preterm delivery  Leonard SchwartzArthur Vernon Iver Fehrenbach, M.D. 03/24/2016 7:04 PM

## 2016-03-25 LAB — UIFE/LIGHT CHAINS/TP QN, 24-HR UR
% BETA, Urine: 15.2 %
ALPHA 1 URINE: 8.2 %
ALPHA 2 UR: 8.1 %
Albumin, U: 57.3 %
FREE LT CHN EXCR RATE: 150 mg/L — AB (ref 1.35–24.19)
Free Kappa/Lambda Ratio: 8.29 (ref 2.04–10.37)
Free Lambda Lt Chains,Ur: 18.1 mg/L — ABNORMAL HIGH (ref 0.24–6.66)
GAMMA GLOBULIN URINE: 11.2 %
TIME-UPE24: 24 h
TOTAL PROTEIN, URINE-UPE24: 58 mg/dL
TOTAL PROTEIN, URINE-UR/DAY: 1392 mg/(24.h) — AB (ref 30–150)
Volume, Urine: 2400 mL

## 2016-03-25 LAB — CBC
HCT: 32.7 % — ABNORMAL LOW (ref 36.0–46.0)
HEMOGLOBIN: 11.6 g/dL — AB (ref 12.0–15.0)
MCH: 30.7 pg (ref 26.0–34.0)
MCHC: 35.5 g/dL (ref 30.0–36.0)
MCV: 86.5 fL (ref 78.0–100.0)
Platelets: 212 10*3/uL (ref 150–400)
RBC: 3.78 MIL/uL — AB (ref 3.87–5.11)
RDW: 13.4 % (ref 11.5–15.5)
WBC: 17.9 10*3/uL — ABNORMAL HIGH (ref 4.0–10.5)

## 2016-03-25 LAB — COMPREHENSIVE METABOLIC PANEL
ALK PHOS: 87 U/L (ref 38–126)
ALT: 17 U/L (ref 14–54)
ANION GAP: 7 (ref 5–15)
AST: 21 U/L (ref 15–41)
Albumin: 2.7 g/dL — ABNORMAL LOW (ref 3.5–5.0)
BUN: 11 mg/dL (ref 6–20)
CALCIUM: 8.7 mg/dL — AB (ref 8.9–10.3)
CO2: 25 mmol/L (ref 22–32)
CREATININE: 0.54 mg/dL (ref 0.44–1.00)
Chloride: 103 mmol/L (ref 101–111)
Glucose, Bld: 80 mg/dL (ref 65–99)
Potassium: 3.9 mmol/L (ref 3.5–5.1)
Sodium: 135 mmol/L (ref 135–145)
TOTAL PROTEIN: 6.5 g/dL (ref 6.5–8.1)
Total Bilirubin: 0.5 mg/dL (ref 0.3–1.2)

## 2016-03-25 MED ORDER — NIFEDIPINE ER OSMOTIC RELEASE 30 MG PO TB24
60.0000 mg | ORAL_TABLET | Freq: Every day | ORAL | Status: DC
  Administered 2016-03-25 – 2016-04-02 (×9): 60 mg via ORAL
  Filled 2016-03-25 (×9): qty 2

## 2016-03-25 MED ORDER — PROMETHAZINE HCL 25 MG/ML IJ SOLN
25.0000 mg | Freq: Once | INTRAMUSCULAR | Status: AC
  Administered 2016-03-25: 25 mg via INTRAVENOUS
  Filled 2016-03-25: qty 1

## 2016-03-25 MED ORDER — OXYCODONE HCL 5 MG PO TABS: 5.0000 mg | ORAL_TABLET | Freq: Four times a day (QID) | ORAL | Status: DC | PRN

## 2016-03-25 MED ORDER — OXYCODONE HCL 5 MG PO TABS: 10.0000 mg | ORAL_TABLET | Freq: Four times a day (QID) | ORAL | Status: DC | PRN

## 2016-03-25 MED ORDER — OXYCODONE HCL 5 MG PO TABS
5.0000 mg | ORAL_TABLET | Freq: Four times a day (QID) | ORAL | Status: DC | PRN
  Administered 2016-03-25 – 2016-04-05 (×2): 5 mg via ORAL
  Filled 2016-03-25 (×4): qty 1

## 2016-03-25 NOTE — Consult Note (Signed)
Late entry -patient seen about 8pm on 03/24/16  Asked by Dr.Stringer to provide prenatal consultation for patient at risk for preterm delivery due to preeclampsia.  Mother is 25 y.o. G1 who is now 1228 6/[redacted] weeks EGA, with pregnancy complicated by gestational hypertension.  She had been admitted 8/23 but was discharged 8/24 after BP improved without antihypertensives and PIH labs were normal.  During that hospitalization she received  Betamethasone (2nd dose 8/24).  She was then readmitted 8/27 and has been started on Procardia.  Pregnancy otherwise uncomplicated.  Plans are to delay delivery to 34 wks unless she shows worsening labs, increasing BP despite antihypertensives, or severe headache, or decreased fetal well-being.  Discussed with patient and father of baby usual expectations for preterm infant at 5529 weeks gestation, including possible needs for DR resuscitation, respiratory support, IV access, and blood products.  Presented optimistic long term outcome since now almost 29 wks and s/p BMZ, but mentioned possibility of death or long-term complications. Projected possible length of stay in NICU until 37 - [redacted] wks EGA.  Discussed advantages of feeding with mother's milk.  She plans to pump postnatally.  Patient was attentive, had appropriate questions, and was appreciative of my input.  Thank you for consulting Neonatology.  Total time 20 minutes  JWimmer, MD

## 2016-03-25 NOTE — Progress Notes (Signed)
Hospital day # 2 pregnancy at 8232w0d, pre-eclampsia with severe features  S: c/o headache not responding to oxycodone.denies visual sx or epigastric pain       reports good fetal activity      Contractions:none      Vaginal bleeding:none now       Vaginal discharge: no significant change  O: BP (!) 147/101   Pulse 78   Temp 97.9 F (36.6 C) (Oral)   Resp 16   Ht 5' 5.5" (1.664 m)   Wt 179 lb (81.2 kg)   LMP 07/14/2015 (Exact Date)   BMI 29.33 kg/m       Fetal tracings:reviewed and reassuring      Uterus gravid and non-tender      Extremities: edema 1+ and no signs of DVT, DTR 3/4 no clonus  A: 4432w0d with pre-eclampsia with severe features     Persistent headache: worsening of PIH vs Procardia related   P:repeat PIH labs stat. Will reevaluate  continue current plan of care  Gracieann Stannard A  MD 03/25/2016 12:44 PM

## 2016-03-25 NOTE — Progress Notes (Signed)
Hospital day # 2 pregnancy at [redacted]w[redacted]d S: sleeping O: BP (!) 148/91 (BP Location: Left Arm)   Pulse (!) 103   Temp 98.1 F (36.7 C) (Oral)   Resp 16   Ht 5' 5.5" (1.664 m)   Wt 179 lb (81.2 kg)   LMP 07/14/2015 (Exact Date)   BMI 29.33 kg/m           Extremities: edema +2 and no significant edema and no signs of DVT Recent Results (from the past 2160 hour(s))  [facility]     Status: Abnormal   Collection Time: 03/19/16 11:25 AM  Result Value Ref Range   Color, Urine YELLOW YELLOW   APPearance HAZY (A) CLEAR   Specific Gravity, Urine 1.015 1.005 - 1.030   pH 6.0 5.0 - 8.0   Glucose, UA NEGATIVE NEGATIVE mg/dL   Hgb urine dipstick TRACE (A) NEGATIVE   Bilirubin Urine NEGATIVE NEGATIVE   Ketones, ur NEGATIVE NEGATIVE mg/dL   Protein, ur 100 (A) NEGATIVE mg/dL   Nitrite NEGATIVE NEGATIVE   Leukocytes, UA NEGATIVE NEGATIVE  Urine microscopic-add on     Status: Abnormal   Collection Time: 03/19/16 11:25 AM  Result Value Ref Range   Squamous Epithelial / LPF 6-30 (A) NONE SEEN   WBC, UA 0-5 0 - 5 WBC/hpf   RBC / HPF NONE SEEN 0 - 5 RBC/hpf   Bacteria, UA NONE SEEN NONE SEEN  Protein / creatinine ratio, urine     Status: Abnormal   Collection Time: 03/19/16 11:25 AM  Result Value Ref Range   Creatinine, Urine 112.00 mg/dL   Total Protein, Urine 97 mg/dL    Comment: NO NORMAL RANGE ESTABLISHED FOR THIS TEST   Protein Creatinine Ratio 0.87 (H) 0.00 - 0.15 mg/mg[Cre]  CBC     Status: Abnormal   Collection Time: 03/19/16  1:27 PM  Result Value Ref Range   WBC 14.2 (H) 4.0 - 10.5 K/uL   RBC 3.81 (L) 3.87 - 5.11 MIL/uL   Hemoglobin 11.6 (L) 12.0 - 15.0 g/dL   HCT 32.9 (L) 36.0 - 46.0 %   MCV 86.4 78.0 - 100.0 fL   MCH 30.4 26.0 - 34.0 pg   MCHC 35.3 30.0 - 36.0 g/dL   RDW 13.5 11.5 - 15.5 %   Platelets 211 150 - 400 K/uL  Comprehensive metabolic panel     Status: Abnormal   Collection Time: 03/19/16  1:27 PM  Result Value Ref Range    Sodium 136 135 - 145 mmol/L   Potassium 4.1 3.5 - 5.1 mmol/L   Chloride 107 101 - 111 mmol/L   CO2 21 (L) 22 - 32 mmol/L   Glucose, Bld 77 65 - 99 mg/dL   BUN 10 6 - 20 mg/dL   Creatinine, Ser 0.68 0.44 - 1.00 mg/dL   Calcium 8.5 (L) 8.9 - 10.3 mg/dL   Total Protein 6.8 6.5 - 8.1 g/dL   Albumin 3.0 (L) 3.5 - 5.0 g/dL   AST 26 15 - 41 U/L   ALT 23 14 - 54 U/L   Alkaline Phosphatase 82 38 - 126 U/L   Total Bilirubin 0.5 0.3 - 1.2 mg/dL   GFR calc non Af Amer >60 >60 mL/min   GFR calc Af Amer >60 >60 mL/min    Comment: (NOTE) The eGFR has been calculated using the CKD EPI equation. This calculation has not been validated in all clinical situations. eGFR's persistently <60 mL/min signify  possible Chronic Kidney Disease.    Anion gap 8 5 - 15  Type and screen [place]     Status: None   Collection Time: 03/19/16  7:31 PM  Result Value Ref Range   ABO/RH(D) AB POS    Antibody Screen NEG    Sample Expiration 03/22/2016   ABO/Rh     Status: None   Collection Time: 03/19/16  7:31 PM  Result Value Ref Range   ABO/RH(D) AB POS   Protein, urine, 24 hour     Status: Abnormal   Collection Time: 03/19/16  9:00 PM  Result Value Ref Range   Urine Total Volume-UPROT 2,400 mL   Collection Interval-UPROT 24 hours   Protein, Urine 60 mg/dL   Protein, 24H Urine 1,440 (H) 50 - 100 mg/day    Comment: Performed at [facility]  CBC     Status: Abnormal   Collection Time: 03/20/16  6:17 AM  Result Value Ref Range   WBC 19.3 (H) 4.0 - 10.5 K/uL   RBC 3.87 3.87 - 5.11 MIL/uL   Hemoglobin 11.8 (L) 12.0 - 15.0 g/dL   HCT 33.5 (L) 36.0 - 46.0 %   MCV 86.6 78.0 - 100.0 fL   MCH 30.5 26.0 - 34.0 pg   MCHC 35.2 30.0 - 36.0 g/dL   RDW 13.5 11.5 - 15.5 %   Platelets 227 150 - 400 K/uL  Comprehensive metabolic panel     Status: Abnormal   Collection Time: 03/20/16  6:17 AM  Result Value Ref Range   Sodium 134 (L) 135 - 145 mmol/L   Potassium 4.7 3.5 - 5.1 mmol/L    Chloride 107 101 - 111 mmol/L   CO2 21 (L) 22 - 32 mmol/L   Glucose, Bld 162 (H) 65 - 99 mg/dL   BUN 14 6 - 20 mg/dL   Creatinine, Ser 0.80 0.44 - 1.00 mg/dL   Calcium 9.4 8.9 - 10.3 mg/dL   Total Protein 6.6 6.5 - 8.1 g/dL   Albumin 2.9 (L) 3.5 - 5.0 g/dL   AST 31 15 - 41 U/L   ALT 26 14 - 54 U/L   Alkaline Phosphatase 87 38 - 126 U/L   Total Bilirubin 0.4 0.3 - 1.2 mg/dL   GFR calc non Af Amer >60 >60 mL/min   GFR calc Af Amer >60 >60 mL/min    Comment: (NOTE) The eGFR has been calculated using the CKD EPI equation. This calculation has not been validated in all clinical situations. eGFR's persistently <60 mL/min signify possible Chronic Kidney Disease.    Anion gap 6 5 - 15  Uric acid     Status: None   Collection Time: 03/20/16  6:17 AM  Result Value Ref Range   Uric Acid, Serum 6.2 2.3 - 6.6 mg/dL  Lactate dehydrogenase     Status: None   Collection Time: 03/20/16  6:17 AM  Result Value Ref Range   LDH 161 98 - 192 U/L  Urinalysis, Routine w reflex microscopic (not at [facility])     Status: Abnormal   Collection Time: 03/22/16 11:55 PM  Result Value Ref Range   Color, Urine YELLOW YELLOW   APPearance CLOUDY (A) CLEAR   Specific Gravity, Urine 1.025 1.005 - 1.030   pH 6.5 5.0 - 8.0   Glucose, UA NEGATIVE NEGATIVE mg/dL   Hgb urine dipstick TRACE (A) NEGATIVE   Bilirubin Urine NEGATIVE NEGATIVE   Ketones, ur NEGATIVE NEGATIVE mg/dL   Protein, ur  100 (A) NEGATIVE mg/dL   Nitrite NEGATIVE NEGATIVE   Leukocytes, UA NEGATIVE NEGATIVE  Protein / creatinine ratio, urine     Status: Abnormal   Collection Time: 03/22/16 11:55 PM  Result Value Ref Range   Creatinine, Urine 79.00 mg/dL   Total Protein, Urine 249 mg/dL    Comment: NO NORMAL RANGE ESTABLISHED FOR THIS TEST RESULTS CONFIRMED BY MANUAL DILUTION    Protein Creatinine Ratio 3.15 (H) 0.00 - 0.15 mg/mg[Cre]  Urine microscopic-add on     Status: Abnormal   Collection Time: 03/22/16 11:55 PM  Result Value Ref Range    Squamous Epithelial / LPF 6-30 (A) NONE SEEN   WBC, UA 0-5 0 - 5 WBC/hpf   RBC / HPF 0-5 0 - 5 RBC/hpf   Bacteria, UA FEW (A) NONE SEEN  CBC     Status: Abnormal   Collection Time: 03/23/16 12:20 AM  Result Value Ref Range   WBC 16.1 (H) 4.0 - 10.5 K/uL   RBC 3.54 (L) 3.87 - 5.11 MIL/uL   Hemoglobin 11.2 (L) 12.0 - 15.0 g/dL   HCT 31.7 (L) 36.0 - 46.0 %   MCV 89.5 78.0 - 100.0 fL   MCH 31.6 26.0 - 34.0 pg   MCHC 35.3 30.0 - 36.0 g/dL   RDW 13.3 11.5 - 15.5 %   Platelets 218 150 - 400 K/uL  Comprehensive metabolic panel     Status: Abnormal   Collection Time: 03/23/16 12:20 AM  Result Value Ref Range   Sodium 134 (L) 135 - 145 mmol/L   Potassium 4.1 3.5 - 5.1 mmol/L   Chloride 106 101 - 111 mmol/L   CO2 21 (L) 22 - 32 mmol/L   Glucose, Bld 104 (H) 65 - 99 mg/dL   BUN 18 6 - 20 mg/dL   Creatinine, Ser 0.70 0.44 - 1.00 mg/dL   Calcium 8.7 (L) 8.9 - 10.3 mg/dL   Total Protein 6.4 (L) 6.5 - 8.1 g/dL   Albumin 3.0 (L) 3.5 - 5.0 g/dL   AST 29 15 - 41 U/L   ALT 25 14 - 54 U/L   Alkaline Phosphatase 87 38 - 126 U/L   Total Bilirubin 0.5 0.3 - 1.2 mg/dL   GFR calc non Af Amer >60 >60 mL/min   GFR calc Af Amer >60 >60 mL/min    Comment: (NOTE) The eGFR has been calculated using the CKD EPI equation. This calculation has not been validated in all clinical situations. eGFR's persistently <60 mL/min signify possible Chronic Kidney Disease.    Anion gap 7 5 - 15  Lactate dehydrogenase     Status: None   Collection Time: 03/23/16 12:20 AM  Result Value Ref Range   LDH 168 98 - 192 U/L  Uric acid     Status: None   Collection Time: 03/23/16 12:20 AM  Result Value Ref Range   Uric Acid, Serum 5.3 2.3 - 6.6 mg/dL  Type and screen [place]     Status: None   Collection Time: 03/23/16 12:20 AM  Result Value Ref Range   ABO/RH(D) AB POS    Antibody Screen NEG    Sample Expiration 03/26/2016   Group B strep by PCR     Status: None   Collection Time: 03/23/16  12:47 AM  Result Value Ref Range   Group B strep by PCR NEGATIVE NEGATIVE  Fetal fibronectin     Status: None   Collection Time: 03/23/16 12:47 AM  Result Value  Ref Range   Fetal Fibronectin NEGATIVE NEGATIVE    A: 59w0dwith preeclampsia     gradually worsening- Protein Creatinine Ratio 3.15; No severe features. B/P's stable  P: Collect 24 hour urine      continue current plan of care  LLarey Days MD 03/25/2016 2:25 AM

## 2016-03-26 LAB — TYPE AND SCREEN
ABO/RH(D): AB POS
ANTIBODY SCREEN: NEGATIVE

## 2016-03-26 LAB — PROTEIN, URINE, 24 HOUR
Collection Interval-UPROT: 24 hours
Protein, 24H Urine: 4935 mg/d — ABNORMAL HIGH (ref 50–100)
Protein, Urine: 139 mg/dL
Urine Total Volume-UPROT: 3550 mL

## 2016-03-26 NOTE — Lactation Note (Signed)
Lactation Consultation Note  Patient Name: Racheal Patchesmonie Vessey RUEAV'WToday's Date: 03/26/2016   Mom on Antepartum requesting to see LC to discuss her desire to nurse her baby after delivery. Mom given NICU booklet with review, and discussed pumping as soon after deliver as possible. Answered mom's questions, and mom aware of LC services.   Maternal Data    Feeding    LATCH Score/Interventions                      Lactation Tools Discussed/Used     Consult Status      Sherlyn HayJennifer D Karla Vines 03/26/2016, 3:14 PM

## 2016-03-26 NOTE — Progress Notes (Addendum)
Patient ID: Charlotte PatchesAmonie Walton, female   DOB: 05/12/1991, 25 y.o.   MRN: [id number] Charlotte Walton is a 25 y.o. G1P0000 at 5092w1d admitted for preeclampsia with severe features  Subjective: Denies HA, visual changes or abdominal pain.  Pt had headache yesterday that responded to phenergan.  Objective: BP (!) 142/96   Pulse 88   Temp 98.5 F (36.9 C) (Oral)   Resp 16   Ht 5' 5.5" (1.664 m)   Wt 181 lb 1.6 oz (82.1 kg)   LMP 07/14/2015 (Exact Date)   BMI 29.68 kg/m  I/O last 3 completed shifts: In: 6379.4 [I.V.:6379.4] Out: -  No intake/output data recorded.  Physical Exam:  Gen: alert Chest/Lungs: cta bilaterally  Heart/Pulse: RRR  Abdomen: soft, gravid, nontender Uterine fundus: soft, nontender Skin & Color: warm and dry  EXT: negative Homan's b/l, edema trace  FHT:  FHR: 150s bpm, variability: moderate,  accelerations:  Present,  decelerations:  Absent UC:   none SVE:   Dilation: Closed Exam by:: Manfred ArchV. Latham, cnm  GBS neg  Labs: Lab Results  Component Value Date   WBC 17.9 (H) 03/25/2016   HGB 11.6 (L) 03/25/2016   HCT 32.7 (L) 03/25/2016   MCV 86.5 03/25/2016   PLT 212 03/25/2016   24hr urine with 4935g protein  Assessment and Plan: has Pre-eclampsia, severe, antepartum and History of irregular menstrual cycles on her problem list. Pt stable currently stable on procardia Fetal status is reassuring Labs due tomorrow NST q shift Plan to deliver at 34wks Plan GCT on 9/6   Gabriell Daigneault Y 03/26/2016, 1:08 PM

## 2016-03-27 ENCOUNTER — Other Ambulatory Visit (HOSPITAL_COMMUNITY): Payer: Managed Care, Other (non HMO)

## 2016-03-27 LAB — COMPREHENSIVE METABOLIC PANEL
ALK PHOS: 80 U/L (ref 38–126)
ALT: 18 U/L (ref 14–54)
AST: 22 U/L (ref 15–41)
Albumin: 2.6 g/dL — ABNORMAL LOW (ref 3.5–5.0)
Anion gap: 6 (ref 5–15)
BUN: 15 mg/dL (ref 6–20)
CALCIUM: 8.6 mg/dL — AB (ref 8.9–10.3)
CHLORIDE: 105 mmol/L (ref 101–111)
CO2: 23 mmol/L (ref 22–32)
CREATININE: 0.58 mg/dL (ref 0.44–1.00)
GFR calc Af Amer: >60 mL/min (ref >60)
GFR calc non Af Amer: >60 mL/min (ref >60)
GLUCOSE: 83 mg/dL (ref 65–99)
Potassium: 4.2 mmol/L (ref 3.5–5.1)
SODIUM: 134 mmol/L — AB (ref 135–145)
Total Bilirubin: 0.5 mg/dL (ref 0.3–1.2)
Total Protein: 6 g/dL — ABNORMAL LOW (ref 6.5–8.1)

## 2016-03-27 LAB — CBC
HCT: 32.5 % — ABNORMAL LOW (ref 36.0–46.0)
HEMOGLOBIN: 11.5 g/dL — AB (ref 12.0–15.0)
MCH: 30.7 pg (ref 26.0–34.0)
MCHC: 35.4 g/dL (ref 30.0–36.0)
MCV: 86.9 fL (ref 78.0–100.0)
PLATELETS: 205 10*3/uL (ref 150–400)
RBC: 3.74 MIL/uL — AB (ref 3.87–5.11)
RDW: 13.6 % (ref 11.5–15.5)
WBC: 14.8 10*3/uL — AB (ref 4.0–10.5)

## 2016-03-27 NOTE — Progress Notes (Addendum)
Patient ID: Charlotte PatchesAmonie Walton, female   DOB: 09-20-1990, 25 y.o.   MRN: [id number] Charlotte Walton is a 25 y.o. G1P0000 at 3266w2d admitted for preeclampsia with severe features  Subjective: Denies HA, visual changes or abdominal pain.  Good FM, no LOF, no VB and no CTXs.  Objective: BP (!) 139/98 (BP Location: Left Arm)   Pulse 89   Temp 98.1 F (36.7 C) (Oral)   Resp 16   Ht 5' 5.5" (1.664 m)   Wt 181 lb 1.6 oz (82.1 kg)   LMP 07/14/2015 (Exact Date)   BMI 29.68 kg/m  I/O last 3 completed shifts: In: 450 [I.V.:450] Out: -  No intake/output data recorded.  Physical Exam:  Gen: alert Chest/Lungs: cta bilaterally  Heart/Pulse: RRR  Abdomen: soft, gravid, nontender Uterine fundus: soft, nontender Skin & Color: warm and dry  EXT: negative Homan's b/l, edema trace  FHT:  FHR: 150s bpm, variability: moderate,  accelerations:  Present,  decelerations:  Absent UC:   none SVE:   Dilation: Closed Exam by:: Manfred ArchV. Latham, cnm  GBS neg U/S 03/20/16 2lbs 11oz 54%, cephalic,nl fluid  Labs: Lab Results  Component Value Date   WBC 14.8 (H) 03/27/2016   HGB 11.5 (L) 03/27/2016   HCT 32.5 (L) 03/27/2016   MCV 86.9 03/27/2016   PLT 205 03/27/2016    Assessment and Plan: has Pre-eclampsia, severe, antepartum and History of irregular menstrual cycles on her problem list. Preeclampsia with severe features currently stable BPs stable on Procardia XL 60mg  Labs wnl today GCT 9/6 MFM rec growth u/s q 3-4wks Plan induction evening of 04/29/16 (Husband can not be here during the day on 04/29/16)  Philemon Riedesel Y 03/27/2016, 4:10 PM

## 2016-03-28 NOTE — Progress Notes (Signed)
25 y.o. year old female,at 4876w3d gestation. The patient is admitted with preeclampsia with severe features. She has received betamethasone. She is currently taking Procardia XL 60. She has had an ultrasound that showed an appropriately grown infant in a vertex presentation. Amniotic fluid volume was normal.  SUBJECTIVE:  Doing well. No headaches, blurred vision, or right upper quadrant tenderness  OBJECTIVE:  BP (!) 141/94   Pulse 91   Temp 98.5 F (36.9 C) (Oral)   Resp 18   Ht 5' 5.5" (1.664 m)   Wt 181 lb 1.6 oz (82.1 kg)   LMP 07/14/2015 (Exact Date)   BMI 29.68 kg/m   Fetal Heart Tones:  Category 1  Contractions:          None  Abdomen: Nontender Reflexes: Normal  ASSESSMENT:  376w3d Weeks Pregnancy  Preeclampsia with severe features  Stable on Procardia XL 60  PLAN:  Continue in-hospital management.  Check PIH labs twice each week (next evaluation on Monday, September 4).  Glucose tolerance test in 2 weeks.  See MFM recommendations listed below for appropriate indications for delivery:  Recommendations:  1) Would start antihypertensive medications. My first choice would be Procardia XL. Would start out at 30 mg daily and titrate upward to achieve target blood pressures in the 140/90 range. The maximum dose would be 120 mg total dose daily. 2) Recommend preeclampsia labs at least 2x weekly (Monday/Thursday or Tuesday/Friday) or more frequently if any significant clinical change occurs 3) At least daily fetal strips or more frequently as clinically indicated 4) Would start Magnesium sulfate and move toward delivery for: 1) LFTS > 2x normal 2) Creat > 1.2 3) Platelets < 100 4) Severe range blood pressures (SBP at or > 160 or DBP at or > 110) despite antihypertensive medications 5) Severe headache that dose not improve with medications 6) Concerns over fetal testing 5) Recommend serial ultrasounds for growth every 3-4 weeks 7) Delivery by [redacted] weeks gestation due  to preeclampsia with severe features if otherwise stable and not meeting criteria for earlier delivery 8) Would recommend NICU consultation due to likely preterm delivery  Leonard SchwartzArthur Vernon Sheza Strickland, M.D. 03/28/2016 11:12 AM

## 2016-03-29 LAB — TYPE AND SCREEN
ABO/RH(D): AB POS
ANTIBODY SCREEN: NEGATIVE

## 2016-03-29 NOTE — Progress Notes (Signed)
25 y.o. year old female,at 6440w4d gestation. The patient is admitted with preeclampsia with severe features. She has received betamethasone. Taking Procardia XL 60. Last ultrasound 8/24: vertex presentation, AGA.   SUBJECTIVE:  Doing well. No headaches, blurred vision, or right upper quadrant tenderness.  No F/C/CP/SOB.  +FM, no contractions, no LOF, no VB.  No acute complaints.  OBJECTIVE:  BP (!) 132/96   Pulse 97   Temp 98 F (36.7 C) (Oral)   Resp 20   Ht 5' 5.5" (1.664 m)   Wt 82.1 kg (181 lb 1.6 oz)   LMP 07/14/2015 (Exact Date)   BMI 29.68 kg/m   BP range: 132-145/91-105   Gen: NAD  Skin: normal, no rashes CV: RRR Lungs: CTAB Abd: gravid, non-tender, no RUQ pain GU: Deferred Ext: no edema, no calf tenderness, no clonus bilaterally   Fetal Heart Tones:  Category 1  Contractions:          None   ASSESSMENT:  1740w4d Weeks Pregnancy  Preeclampsia with severe features  Stable on Procardia XL 60  PLAN:  Continue in-hospital management.  Check PIH labs twice each week (next evaluation on Monday, September 4).  Glucose tolerance test scheduled for 9/6  See MFM recommendations listed below for appropriate indications for delivery:  Recommendations:  1) Would start antihypertensive medications. My first choice would be Procardia XL. Would start out at 30 mg daily and titrate upward to achieve target blood pressures in the 140/90 range. The maximum dose would be 120 mg total dose daily. 2) Recommend preeclampsia labs at least 2x weekly (Monday/Thursday or Tuesday/Friday) or more frequently if any significant clinical change occurs 3) At least daily fetal strips or more frequently as clinically indicated 4) Would start Magnesium sulfate and move toward delivery for: 1) LFTS > 2x normal 2) Creat > 1.2 3) Platelets < 100 4) Severe range blood pressures (SBP at or > 160 or DBP at or > 110) despite antihypertensive medications 5) Severe headache that dose not improve  with medications 6) Concerns over fetal testing 5) Recommend serial ultrasounds for growth every 3-4 weeks 7) Delivery by [redacted] weeks gestation due to preeclampsia with severe features if otherwise stable and not meeting criteria for earlier delivery 8) Would recommend NICU consultation due to likely preterm delivery  Myna HidalgoJennifer Sumayya Muha, DO [phone] (pager) [phone] (office)

## 2016-03-30 NOTE — Progress Notes (Signed)
25 y.o. year old female,at 102w5d gestation. The patient is admitted with preeclampsia with severe features. She has received betamethasone. Taking Procardia XL 60. Last ultrasound 8/24: vertex presentation, AGA.   Subjective: Patient without complaints. She denies headache , visual disturbance or ruq pain. +FM no lof no vaginal bleeding no contractions   Objective: BP (!) 137/95   Pulse 85   Temp 98.2 F (36.8 C) (Oral)   Resp 18   Ht 5' 5.5" (1.664 m)   Wt 181 lb 1.6 oz (82.1 kg)   LMP 07/14/2015 (Exact Date)   BMI 29.68 kg/m  No intake/output data recorded. No intake/output data recorded.  General Alert and Oriented Skin Warm moist  Lungs No distress good effort  Abdomen  Gravid nontender  Extremities No edema 1+ reflexes  FHR Category 1  Labs: Lab Results  Component Value Date   WBC 14.8 (H) 03/27/2016   HGB 11.5 (L) 03/27/2016   HCT 32.5 (L) 03/27/2016   MCV 86.9 03/27/2016   PLT 205 03/27/2016    ASSESSMENT:  542w5d Weeks Pregnancy  Preeclampsia with severe features  Stable on Procardia XL 60  PLAN:  Continue in-hospital management.  Check PIH labs twice each week (next evaluation on Monday, September 4).  Glucose tolerance test scheduled for 9/6  See MFM recommendations listed below for appropriate indications for delivery:  Recommendations:  1) Would start antihypertensive medications. My first choice would be Procardia XL. Would start out at 30 mg daily and titrate upward to achieve target blood pressures in the 140/90 range. The maximum dose would be 120 mg total dose daily. 2) Recommend preeclampsia labs at least 2x weekly (Monday/Thursday or Tuesday/Friday) or more frequently if any significant clinical change occurs 3) At least daily fetal strips or more frequently as clinically indicated 4) Would start Magnesium sulfate and move toward delivery for: 1) LFTS > 2x normal 2) Creat > 1.2 3) Platelets < 100 4) Severe range blood  pressures (SBP at or > 160 or DBP at or > 110) despite antihypertensive medications 5) Severe headache that dose not improve with medications 6) Concerns over fetal testing 5) Recommend serial ultrasounds for growth every 3-4 weeks 7) Delivery by [redacted] weeks gestation due to preeclampsia with severe features if otherwise stable and not meeting criteria for earlier delivery 8) Would recommend NICU consultation due to likely preterm delivery Charlotte Walton J. 03/30/2016, 7:40 AM

## 2016-03-31 LAB — COMPREHENSIVE METABOLIC PANEL
ALBUMIN: 2.2 g/dL — AB (ref 3.5–5.0)
ALK PHOS: 93 U/L (ref 38–126)
ALT: 17 U/L (ref 14–54)
ANION GAP: 5 (ref 5–15)
AST: 21 U/L (ref 15–41)
BUN: 11 mg/dL (ref 6–20)
CALCIUM: 8.4 mg/dL — AB (ref 8.9–10.3)
CHLORIDE: 106 mmol/L (ref 101–111)
CO2: 22 mmol/L (ref 22–32)
Creatinine, Ser: 0.64 mg/dL (ref 0.44–1.00)
GFR calc non Af Amer: >60 mL/min (ref >60)
GLUCOSE: 94 mg/dL (ref 65–99)
Potassium: 4.1 mmol/L (ref 3.5–5.1)
SODIUM: 133 mmol/L — AB (ref 135–145)
Total Bilirubin: 0.6 mg/dL (ref 0.3–1.2)
Total Protein: 5.4 g/dL — ABNORMAL LOW (ref 6.5–8.1)

## 2016-03-31 LAB — CBC
HEMATOCRIT: 31.2 % — AB (ref 36.0–46.0)
Hemoglobin: 10.9 g/dL — ABNORMAL LOW (ref 12.0–15.0)
MCH: 30.5 pg (ref 26.0–34.0)
MCHC: 34.9 g/dL (ref 30.0–36.0)
MCV: 87.4 fL (ref 78.0–100.0)
PLATELETS: 202 10*3/uL (ref 150–400)
RBC: 3.57 MIL/uL — AB (ref 3.87–5.11)
RDW: 13.5 % (ref 11.5–15.5)
WBC: 13.9 10*3/uL — AB (ref 4.0–10.5)

## 2016-03-31 LAB — URIC ACID: URIC ACID, SERUM: 5.8 mg/dL (ref 2.3–6.6)

## 2016-03-31 LAB — LACTATE DEHYDROGENASE: LDH: 141 U/L (ref 98–192)

## 2016-03-31 NOTE — Progress Notes (Signed)
25 y.o. year old female at 7344w6d gestation. The patient is admitted with preeclampsia with severe features. She has received betamethasone. Taking Procardia XL 60. Last ultrasound 8/24: vertex presentation, AGA.   Subjective: Patient without complaints. She denies headache , visual disturbance or ruq pain. +FM no lof no vaginal bleeding no contractions.  No acute complaints or concerns.  Objective: BP (!) 141/96   Pulse 90   Temp 98.3 F (36.8 C) (Oral)   Resp 20   Ht 5' 5.5" (1.664 m)   Wt 82.1 kg (181 lb 1.6 oz)   LMP 07/14/2015 (Exact Date)   BMI 29.68 kg/m  No intake/output data recorded. No intake/output data recorded.  General Alert and Oriented Skin Warm moist  Lungs No distress good effort  Abdomen  Gravid nontender  Extremities No edema 1+ reflexes  FHR Category 1  Labs: Lab Results  Component Value Date   WBC 13.9 (H) 03/31/2016   HGB 10.9 (L) 03/31/2016   HCT 31.2 (L) 03/31/2016   MCV 87.4 03/31/2016   PLT 202 03/31/2016    ASSESSMENT:  7544w6d Weeks Pregnancy  Preeclampsia with severe features  Stable on Procardia XL 60  PLAN:  Continue in-hospital management.  Check PIH labs twice each week (next evaluation today-pending)  Glucose tolerance test scheduled for 9/6  See MFM recommendations listed below for appropriate indications for delivery:  Recommendations:  1) Would start antihypertensive medications. My first choice would be Procardia XL. Would start out at 30 mg daily and titrate upward to achieve target blood pressures in the 140/90 range. The maximum dose would be 120 mg total dose daily. 2) Recommend preeclampsia labs at least 2x weekly (Monday/Thursday or Tuesday/Friday) or more frequently if any significant clinical change occurs 3) At least daily fetal strips or more frequently as clinically indicated 4) Would start Magnesium sulfate and move toward delivery for: 1) LFTS > 2x normal 2) Creat > 1.2 3) Platelets < 100 4)  Severe range blood pressures (SBP at or > 160 or DBP at or > 110) despite antihypertensive medications 5) Severe headache that dose not improve with medications 6) Concerns over fetal testing 5) Recommend serial ultrasounds for growth every 3-4 weeks 7) Delivery by [redacted] weeks gestation due to preeclampsia with severe features if otherwise stable and not meeting criteria for earlier delivery 8) Would recommend NICU consultation due to likely preterm delivery Myna HidalgoOZAN, Kishia Shackett, M 03/31/2016, 5:33 AM

## 2016-04-01 ENCOUNTER — Encounter (HOSPITAL_COMMUNITY): Payer: Managed Care, Other (non HMO)

## 2016-04-01 LAB — TYPE AND SCREEN
ABO/RH(D): AB POS
ANTIBODY SCREEN: NEGATIVE

## 2016-04-01 MED ORDER — FERROUS SULFATE 325 (65 FE) MG PO TABS
325.0000 mg | ORAL_TABLET | Freq: Every day | ORAL | Status: DC
  Administered 2016-04-02 – 2016-04-05 (×4): 325 mg via ORAL
  Filled 2016-04-01 (×9): qty 1

## 2016-04-01 NOTE — Progress Notes (Signed)
Hospital day # 9 pregnancy at 595w0d:   25 y.o.G1 admitted with preeclampsia with severe features. She has received betamethasone. TakingProcardia XL 60. Last ultrasound 8/24: vertex presentation, AGA.   S: well, reports good fetal activity. Denies PIH symptoms except for occasional scotomas      Contractions:none      Vaginal bleeding:none now       Vaginal discharge: no significant change  O: BP (!) 142/99   Pulse 89   Temp 98.2 F (36.8 C) (Oral)   Resp 20   Ht 5' 5.5" (1.664 m)   Wt 181 lb 1.6 oz (82.1 kg)   LMP 07/14/2015 (Exact Date)   BMI 29.68 kg/m       Fetal tracings:reviewed and reassuring      Uterus gravid and non-tender      Extremities: no significant edema and no signs of DVT DTR brisk No clonus  A: 215w0d with pre-eclampsia with severe features     stable  P: Continue in-hospital management.  Check PIH labs twice each week (next evaluation 04/03/16 )  Glucose tolerance test scheduled for 9/6  See MFM recommendations listed below for appropriate indications for delivery:  Recommendations:  1) Would start antihypertensive medications. My first choice would be Procardia XL. Would start out at 30 mg daily and titrate upward to achieve target blood pressures in the 140/90 range. The maximum dose would be 120 mg total dose daily. 2) Recommend preeclampsia labs at least 2x weekly (Monday/Thursday or Tuesday/Friday) or more frequently if any significant clinical change occurs 3) At least daily fetal strips or more frequently as clinically indicated 4) Would start Magnesium sulfate and move toward delivery for: 1) LFTS > 2x normal 2) Creat > 1.2 3) Platelets < 100 4) Severe range blood pressures (SBP at or > 160 or DBP at or > 110) despite antihypertensive medications 5) Severe headache that dose not improve with medications 6) Concerns over fetal testing 5) Recommend serial ultrasounds for growth every 3-4 weeks 7) Delivery by [redacted] weeks gestation due to  preeclampsia with severe features if otherwise stable and not meeting criteria for earlier delivery 8) Would recommend NICU consultation due to likely preterm delivery  Eliud Polo A  MD 04/01/2016 11:06 AM

## 2016-04-02 ENCOUNTER — Encounter (HOSPITAL_COMMUNITY): Payer: Self-pay | Admitting: *Deleted

## 2016-04-02 LAB — GLUCOSE TOLERANCE, 1 HOUR: GLUCOSE 1 HOUR GTT: 143 mg/dL — AB (ref 70–140)

## 2016-04-02 MED ORDER — NIFEDIPINE ER OSMOTIC RELEASE 30 MG PO TB24
90.0000 mg | ORAL_TABLET | Freq: Every day | ORAL | Status: DC
  Administered 2016-04-03: 90 mg via ORAL
  Filled 2016-04-02: qty 3

## 2016-04-02 MED ORDER — NIFEDIPINE ER OSMOTIC RELEASE 30 MG PO TB24
30.0000 mg | ORAL_TABLET | Freq: Once | ORAL | Status: AC
  Administered 2016-04-02: 30 mg via ORAL
  Filled 2016-04-02: qty 1

## 2016-04-02 NOTE — Progress Notes (Signed)
Initial Nutrition Assessment  DOCUMENTATION CODES:  Not applicable  INTERVENTION:  Regular diet, snacks TID if desired Noted 1 hr GTT of 143, discussed potential for 3 hr GTT with pt and if failed GDM diet  NUTRITION DIAGNOSIS:  Increased nutrient needs related to  (pregnancy and fetal growth requirements) as evidenced by  (30 weeks IUP).  GOAL:  Patient will meet greater than or equal to 90% of their needs  MONITOR:  Labs, Weight trends  REASON FOR ASSESSMENT:  Antenatal   ASSESSMENT:  30 1/7 weeks adm with preeclampsia. Pre-preg weight 134 lbs, BMI 22. 56 lb weight gain. Appetite excellent Noted 1 hr GTT of 143. Discussed with pt, potential for 3 hr GTT and if that failed change in diet order to gestational CHO modified diet. Reviewed basic parameters of GDM diet so pt would be prepared ahead of time for changes to allowable food options. Reviewed reasons she would need to adhere to GDM diet if 3 hr GTT failed  Diet Order:  Diet regular Room service appropriate? Yes; Fluid consistency: Thin  Skin:  Reviewed, no issues  Height:   Ht Readings from Last 1 Encounters:  03/23/16 5' 5.5" (1.664 m)    Weight:   Wt Readings from Last 1 Encounters:  04/02/16 190 lb 14.4 oz (86.6 kg)    Ideal Body Weight:     BMI:  Body mass index is 31.28 kg/m.  Estimated Nutritional Needs:   Kcal:  1800-2000  Protein:  80-90 g  Fluid:  2.2 L  EDUCATION NEEDS:   No education needs identified at this time  Inez PilgrimKatherine Iverna Hammac M.Odis LusterEd. R.D. LDN Neonatal Nutrition Support Specialist/RD III Pager [phone]      Phone [phone]

## 2016-04-02 NOTE — Progress Notes (Signed)
Hospital day # 10 pregnancy at 6138w1d:   25 y.o.G1 admitted with preeclampsia with severe features. She has received betamethasone. TakingProcardia XL 60. Last ultrasound 8/24: vertex presentation, AGA.   S: well, reports good fetal activity. Denies PIH symptoms except for occasional scotomas      Contractions:none      Vaginal bleeding:none now       Vaginal discharge: no significant change  O: BP (!) 140/94 (BP Location: Left Arm)   Pulse 99   Temp 98.5 F (36.9 C) (Oral)   Resp 18   Ht 5' 5.5" (1.664 m)   Wt 181 lb 1.6 oz (82.1 kg)   LMP 07/14/2015 (Exact Date)   SpO2 99%   BMI 29.68 kg/m    Patient required 1 dose of IV Labetalol last night       Fetal tracings:reviewed and reassuring      Uterus gravid and non-tender      Extremities: no significant edema and no signs of DVT DTR brisk No clonus  A: 6538w1d with pre-eclampsia with severe features     stable  P: Continue in-hospital management.Increase Procardia XL to 90 mg daily  Check PIH labs twice each week (next evaluation 04/03/16 )  Glucose tolerance test scheduled for 9/6  See MFM recommendations listed below for appropriate indications for delivery:  Recommendations:  1) Would start antihypertensive medications. My first choice would be Procardia XL. Would start out at 30 mg daily and titrate upward to achieve target blood pressures in the 140/90 range. The maximum dose would be 120 mg total dose daily. 2) Recommend preeclampsia labs at least 2x weekly (Monday/Thursday or Tuesday/Friday) or more frequently if any significant clinical change occurs 3) At least daily fetal strips or more frequently as clinically indicated 4) Would start Magnesium sulfate and move toward delivery for: 1) LFTS > 2x normal 2) Creat > 1.2 3) Platelets < 100 4) Severe range blood pressures (SBP at or > 160 or DBP at or > 110) despite antihypertensive medications 5) Severe headache that dose not improve with medications 6)  Concerns over fetal testing 5) Recommend serial ultrasounds for growth every 3-4 weeks 7) Delivery by [redacted] weeks gestation due to preeclampsia with severe features if otherwise stable and not meeting criteria for earlier delivery 8) Would recommend NICU consultation due to likely preterm delivery  Preciosa Bundrick A  MD 04/02/2016 10:37 AM

## 2016-04-02 NOTE — Progress Notes (Signed)
Called lab to have GTT drawn @09 :25 (1 hr after drinking glucola).

## 2016-04-03 LAB — COMPREHENSIVE METABOLIC PANEL
ALT: 24 U/L (ref 14–54)
AST: 26 U/L (ref 15–41)
Albumin: 2.1 g/dL — ABNORMAL LOW (ref 3.5–5.0)
Alkaline Phosphatase: 92 U/L (ref 38–126)
Anion gap: 6 (ref 5–15)
BUN: 15 mg/dL (ref 6–20)
CHLORIDE: 108 mmol/L (ref 101–111)
CO2: 22 mmol/L (ref 22–32)
Calcium: 8.5 mg/dL — ABNORMAL LOW (ref 8.9–10.3)
Creatinine, Ser: 0.67 mg/dL (ref 0.44–1.00)
GFR calc Af Amer: >60 mL/min (ref >60)
GFR calc non Af Amer: >60 mL/min (ref >60)
GLUCOSE: 85 mg/dL (ref 65–99)
POTASSIUM: 3.9 mmol/L (ref 3.5–5.1)
SODIUM: 136 mmol/L (ref 135–145)
Total Protein: 5.5 g/dL — ABNORMAL LOW (ref 6.5–8.1)

## 2016-04-03 LAB — CBC
HEMATOCRIT: 31.3 % — AB (ref 36.0–46.0)
Hemoglobin: 11 g/dL — ABNORMAL LOW (ref 12.0–15.0)
MCH: 30.7 pg (ref 26.0–34.0)
MCHC: 35.1 g/dL (ref 30.0–36.0)
MCV: 87.4 fL (ref 78.0–100.0)
Platelets: 200 10*3/uL (ref 150–400)
RBC: 3.58 MIL/uL — ABNORMAL LOW (ref 3.87–5.11)
RDW: 13.3 % (ref 11.5–15.5)
WBC: 14 10*3/uL — ABNORMAL HIGH (ref 4.0–10.5)

## 2016-04-03 LAB — URIC ACID: Uric Acid, Serum: 6.4 mg/dL (ref 2.3–6.6)

## 2016-04-03 LAB — LACTATE DEHYDROGENASE: LDH: 139 U/L (ref 98–192)

## 2016-04-03 NOTE — Progress Notes (Signed)
Review of overnight BPs and recent labs:  Vitals:   04/03/16 0400 04/03/16 0608 04/03/16 0611 04/03/16 0626  BP: (!) 137/97 (!) 150/110 (!) 157/111 (!) 149/103  Pulse: 83 93 99 92  Resp: 18 18 18 18   Temp:      TempSrc:      SpO2:      Weight:      Height:       Received IV Labetalol 20 mg at 0616.  Results for orders placed or performed during the hospital encounter of 03/22/16 (from the past 24 hour(s))  Glucose tolerance, 1 hour     Status: Abnormal   Collection Time: 04/02/16  9:33 AM  Result Value Ref Range   Glucose, 1 Hour GTT 143 (H) 70 - 140 mg/dL  Comprehensive metabolic panel     Status: Abnormal (Preliminary result)   Collection Time: 04/03/16  5:37 AM  Result Value Ref Range   Sodium 136 135 - 145 mmol/L   Potassium 3.9 3.5 - 5.1 mmol/L   Chloride 108 101 - 111 mmol/L   CO2 22 22 - 32 mmol/L   Glucose, Bld 85 65 - 99 mg/dL   BUN 15 6 - 20 mg/dL   Creatinine, Ser 1.610.67 0.44 - 1.00 mg/dL   Calcium 8.5 (L) 8.9 - 10.3 mg/dL   Total Protein 5.5 (L) 6.5 - 8.1 g/dL   Albumin 2.1 (L) 3.5 - 5.0 g/dL   AST 26 15 - 41 U/L   ALT 24 14 - 54 U/L   Alkaline Phosphatase 92 38 - 126 U/L   Total Bilirubin PENDING 0.3 - 1.2 mg/dL   GFR calc non Af Amer >60 >60 mL/min   GFR calc Af Amer >60 >60 mL/min   Anion gap 6 5 - 15  CBC     Status: Abnormal   Collection Time: 04/03/16  5:37 AM  Result Value Ref Range   WBC 14.0 (H) 4.0 - 10.5 K/uL   RBC 3.58 (L) 3.87 - 5.11 MIL/uL   Hemoglobin 11.0 (L) 12.0 - 15.0 g/dL   HCT 09.631.3 (L) 04.536.0 - 40.946.0 %   MCV 87.4 78.0 - 100.0 fL   MCH 30.7 26.0 - 34.0 pg   MCHC 35.1 30.0 - 36.0 g/dL   RDW 81.113.3 91.411.5 - 78.215.5 %   Platelets 200 150 - 400 K/uL  Lactate dehydrogenase     Status: None   Collection Time: 04/03/16  5:37 AM  Result Value Ref Range   LDH 139 98 - 192 U/L  Uric acid     Status: None   Collection Time: 04/03/16  5:37 AM  Result Value Ref Range   Uric Acid, Serum 6.4 2.3 - 6.6 mg/dL    Impression: Pre-eclampsia with severe  features--on Procardia 90 XL 90 mg. Elevated 1 hour GTT Normal PIH labs today  Plan: Continue close observation of BP status. 3 hour GTT scheduled tomorrow.  Nigel BridgemanVicki Maeryn Mcgath, CNM 04/03/16 [phone]

## 2016-04-03 NOTE — Progress Notes (Signed)
Patient ID: Charlotte Walton, female   DOB: 06/26/1991, 25 Walton.o.   MRN: [id number] Charlotte Walton is a 25 Walton.o. G1P0000 at 5326w2d admitted for preeclampsia with severe features  Subjective: Denies HA, visual changes, abdominal pain and reports good FM, no VB, no LOF and no contractions.  Objective: BP (!) 147/104 (BP Location: Left Arm)   Pulse (!) 106   Temp 98.1 F (36.7 C) (Oral)   Resp 18   Ht 5' 5.5" (1.664 m)   Wt 190 lb 14.4 oz (86.6 kg)   LMP 07/14/2015 (Exact Date)   SpO2 98%   BMI 31.28 kg/m  No intake/output data recorded. Total I/O In: 240 [P.O.:240] Out: -   Physical Exam:  Gen: alert Chest/Lungs: cta bilaterally  Heart/Pulse: RRR  Abdomen: soft, gravid, nontender Uterine fundus: soft, nontender Skin & Color: warm and dry  Neurological: AOx3, DTRs 2+ EXT: negative Homan's b/l, edema 1+   FHT:  FHR: 140s bpm, variability: moderate,  accelerations:  Present,  decelerations:  Absent UC:   rare SVE:   Dilation: Closed Exam by:: Charlotte Walton, cnm  Labs: Lab Results  Component Value Date   WBC 14.0 (H) 04/03/2016   HGB 11.0 (L) 04/03/2016   HCT 31.3 (L) 04/03/2016   MCV 87.4 04/03/2016   PLT 200 04/03/2016    Assessment and Plan: has Pre-eclampsia, severe, antepartum and History of irregular menstrual cycles on her problem list. Cont procardia xl 90mg  Labs wnl today Cont current care  Charlotte Walton 04/03/2016, 12:11 PM

## 2016-04-04 LAB — GLUCOSE, 2 HOUR GESTATIONAL: Glucose Tolerance, 2 hour: 155 mg/dL (ref 70–164)

## 2016-04-04 LAB — TYPE AND SCREEN
ABO/RH(D): AB POS
ANTIBODY SCREEN: NEGATIVE

## 2016-04-04 LAB — GLUCOSE, 1 HOUR GESTATIONAL: Glucose Tolerance, 1 hour: 151 mg/dL (ref 70–189)

## 2016-04-04 LAB — GLUCOSE, FASTING GESTATIONAL: Glucose Tolerance, Fasting: 79 mg/dL

## 2016-04-04 LAB — GLUCOSE, 3 HOUR GESTATIONAL: GLUCOSE 3 HOUR GTT: 158 mg/dL — AB (ref 70–144)

## 2016-04-04 MED ORDER — NIFEDIPINE ER 60 MG PO TB24
120.0000 mg | ORAL_TABLET | Freq: Every day | ORAL | Status: DC
  Administered 2016-04-04 – 2016-04-12 (×9): 120 mg via ORAL
  Filled 2016-04-04 (×2): qty 2
  Filled 2016-04-04: qty 4
  Filled 2016-04-04: qty 2
  Filled 2016-04-04: qty 4
  Filled 2016-04-04 (×2): qty 2
  Filled 2016-04-04: qty 4
  Filled 2016-04-04: qty 2
  Filled 2016-04-04: qty 4

## 2016-04-04 NOTE — Progress Notes (Signed)
Hospital day # 12 pregnancy at 8632w3d:   [age] y.o.G1 admitted with preeclampsia with severe features. She has received betamethasone. TakingProcardia XL 90. Last ultrasound 8/24: vertex presentation, AGA.   S: well, reports good fetal activity. Denies PIH symptoms.      Contractions:none      Vaginal bleeding:none now       Vaginal discharge: no significant change  O: BP (!) 143/106   Pulse (!) 102   Temp 98.3 F (36.8 C) (Oral)   Resp 16   Ht 5' 5.5" (1.664 m)   Wt 190 lb 14.4 oz (86.6 kg)   LMP 07/14/2015 (Exact Date)   SpO2 98%   BMI 31.28 kg/m    Normal PIH labs yesterday       Fetal tracings:reviewed and reassuring      Uterus gravid and non-tender      Extremities: no significant edema and no signs of DVT DTR brisk No clonus  A: 3832w3d with pre-eclampsia with severe features     stable  P: Continue in-hospital management.Increase Procardia XL to 120 mg daily  Check PIH labs twice each week (next evaluation 04/03/16 )  3 hour GTT: normal  See MFM recommendations listed below for appropriate indications for delivery:  Recommendations:  1) Would start antihypertensive medications. My first choice would be Procardia XL. Would start out at 30 mg daily and titrate upward to achieve target blood pressures in the 140/90 range. The maximum dose would be 120 mg total dose daily. 2) Recommend preeclampsia labs at least 2x weekly (Monday/Thursday or Tuesday/Friday) or more frequently if any significant clinical change occurs 3) At least daily fetal strips or more frequently as clinically indicated 4) Would start Magnesium sulfate and move toward delivery for: 1) LFTS > 2x normal 2) Creat > 1.2 3) Platelets < 100 4) Severe range blood pressures (SBP at or > 160 or DBP at or > 110) despite antihypertensive medications 5) Severe headache that dose not improve with medications 6) Concerns over fetal testing 5) Recommend serial ultrasounds for growth every 3-4 weeks 7) Delivery  by [redacted] weeks gestation due to preeclampsia with severe features if otherwise stable and not meeting criteria for earlier delivery 8) Would recommend NICU consultation due to likely preterm delivery  Jetaun Colbath A  MD 04/04/2016 7:47 AM

## 2016-04-05 LAB — COMPREHENSIVE METABOLIC PANEL
ALT: 36 U/L (ref 14–54)
ALT: 55 U/L — ABNORMAL HIGH (ref 14–54)
AST: 38 U/L (ref 15–41)
AST: 63 U/L — AB (ref 15–41)
Albumin: 2 g/dL — ABNORMAL LOW (ref 3.5–5.0)
Albumin: 2.1 g/dL — ABNORMAL LOW (ref 3.5–5.0)
Alkaline Phosphatase: 105 U/L (ref 38–126)
Alkaline Phosphatase: 119 U/L (ref 38–126)
Anion gap: 6 (ref 5–15)
Anion gap: 6 (ref 5–15)
BUN: 15 mg/dL (ref 6–20)
BUN: 16 mg/dL (ref 6–20)
CHLORIDE: 108 mmol/L (ref 101–111)
CHLORIDE: 104 mmol/L (ref 101–111)
CO2: 23 mmol/L (ref 22–32)
CO2: 21 mmol/L — AB (ref 22–32)
Calcium: 8.4 mg/dL — ABNORMAL LOW (ref 8.9–10.3)
Calcium: 8.5 mg/dL — ABNORMAL LOW (ref 8.9–10.3)
Creatinine, Ser: 0.77 mg/dL (ref 0.44–1.00)
Creatinine, Ser: 0.88 mg/dL (ref 0.44–1.00)
GFR calc Af Amer: >60 mL/min (ref >60)
Glucose, Bld: 91 mg/dL (ref 65–99)
Glucose, Bld: 103 mg/dL — ABNORMAL HIGH (ref 65–99)
POTASSIUM: 4.6 mmol/L (ref 3.5–5.1)
POTASSIUM: 4.1 mmol/L (ref 3.5–5.1)
SODIUM: 137 mmol/L (ref 135–145)
SODIUM: 131 mmol/L — AB (ref 135–145)
Total Bilirubin: 0.6 mg/dL (ref 0.3–1.2)
Total Bilirubin: 0.6 mg/dL (ref 0.3–1.2)
Total Protein: 5.1 g/dL — ABNORMAL LOW (ref 6.5–8.1)
Total Protein: 5.6 g/dL — ABNORMAL LOW (ref 6.5–8.1)

## 2016-04-05 LAB — CBC
HCT: 34.2 % — ABNORMAL LOW (ref 36.0–46.0)
HEMATOCRIT: 34.3 % — AB (ref 36.0–46.0)
Hemoglobin: 11.9 g/dL — ABNORMAL LOW (ref 12.0–15.0)
Hemoglobin: 12.1 g/dL (ref 12.0–15.0)
MCH: 30.5 pg (ref 26.0–34.0)
MCH: 30.9 pg (ref 26.0–34.0)
MCHC: 34.8 g/dL (ref 30.0–36.0)
MCHC: 35.3 g/dL (ref 30.0–36.0)
MCV: 87.7 fL (ref 78.0–100.0)
MCV: 87.7 fL (ref 78.0–100.0)
PLATELETS: 199 10*3/uL (ref 150–400)
Platelets: 190 10*3/uL (ref 150–400)
RBC: 3.9 MIL/uL (ref 3.87–5.11)
RBC: 3.91 MIL/uL (ref 3.87–5.11)
RDW: 13.5 % (ref 11.5–15.5)
RDW: 13.4 % (ref 11.5–15.5)
WBC: 14.5 10*3/uL — AB (ref 4.0–10.5)
WBC: 16.4 10*3/uL — AB (ref 4.0–10.5)

## 2016-04-05 LAB — LACTATE DEHYDROGENASE: LDH: 188 U/L (ref 98–192)

## 2016-04-05 LAB — URIC ACID: URIC ACID, SERUM: 6.8 mg/dL — AB (ref 2.3–6.6)

## 2016-04-05 MED ORDER — HYDRALAZINE HCL 20 MG/ML IJ SOLN
10.0000 mg | Freq: Once | INTRAMUSCULAR | Status: AC | PRN
  Administered 2016-04-06: 10 mg via INTRAVENOUS

## 2016-04-05 MED ORDER — PANTOPRAZOLE SODIUM 40 MG PO TBEC
40.0000 mg | DELAYED_RELEASE_TABLET | Freq: Every day | ORAL | Status: DC
  Administered 2016-04-05 – 2016-04-07 (×2): 40 mg via ORAL
  Filled 2016-04-05 (×3): qty 1

## 2016-04-05 MED ORDER — OXYCODONE HCL 5 MG PO TABS
5.0000 mg | ORAL_TABLET | ORAL | Status: DC | PRN
  Administered 2016-04-05 – 2016-04-07 (×4): 5 mg via ORAL
  Filled 2016-04-05 (×2): qty 1

## 2016-04-05 MED ORDER — MAGNESIUM SULFATE BOLUS VIA INFUSION
4.0000 g | Freq: Once | INTRAVENOUS | Status: AC
  Administered 2016-04-05: 4 g via INTRAVENOUS
  Filled 2016-04-05: qty 500

## 2016-04-05 MED ORDER — MAGNESIUM SULFATE 50 % IJ SOLN
2.0000 g/h | INTRAVENOUS | Status: DC
  Administered 2016-04-06: 2 g/h via INTRAVENOUS
  Filled 2016-04-05 (×2): qty 80

## 2016-04-05 MED ORDER — SALINE SPRAY 0.65 % NA SOLN
1.0000 | NASAL | Status: DC | PRN
  Filled 2016-04-05 (×2): qty 44

## 2016-04-05 MED ORDER — LABETALOL HCL 5 MG/ML IV SOLN
INTRAVENOUS | Status: AC
  Filled 2016-04-05: qty 8

## 2016-04-05 MED ORDER — TERBUTALINE SULFATE 1 MG/ML IJ SOLN: 0.2500 mg | Freq: Once | INTRAMUSCULAR | Status: DC | PRN

## 2016-04-05 MED ORDER — MISOPROSTOL 25 MCG QUARTER TABLET
25.0000 ug | ORAL_TABLET | ORAL | Status: DC | PRN
  Administered 2016-04-05 – 2016-04-06 (×5): 25 ug via VAGINAL
  Filled 2016-04-05 (×5): qty 0.25

## 2016-04-05 MED ORDER — MAGNESIUM SULFATE 50 % IJ SOLN
2.0000 g/h | Freq: Once | INTRAVENOUS | Status: DC
  Filled 2016-04-05: qty 80

## 2016-04-05 MED ORDER — LABETALOL HCL 5 MG/ML IV SOLN
20.0000 mg | INTRAVENOUS | Status: AC | PRN
  Administered 2016-04-05: 40 mg via INTRAVENOUS
  Administered 2016-04-05: 80 mg via INTRAVENOUS
  Administered 2016-04-06: 20 mg via INTRAVENOUS
  Filled 2016-04-05: qty 16
  Filled 2016-04-05: qty 4

## 2016-04-05 MED ORDER — HYDRALAZINE HCL 20 MG/ML IJ SOLN
10.0000 mg | Freq: Once | INTRAMUSCULAR | Status: AC
  Administered 2016-04-05: 10 mg via INTRAVENOUS

## 2016-04-05 NOTE — Progress Notes (Signed)
  Subjective: Sleeping at intervals--still c/o mild HA, but better.  Denies visual sx or epigastric pain.  Has some nasal congestion.  Objective: BP 129/87   Pulse 90   Temp 98.3 F (36.8 C) (Oral)   Resp 18   Ht 5' 5.5" (1.664 m)   Wt 86.6 kg (190 lb 14.4 oz)   LMP 07/14/2015 (Exact Date)   SpO2 98%   BMI 31.28 kg/m  I/O last 3 completed shifts: In: 1434.2 [P.O.:1290; I.V.:144.2] Out: 0  Total I/O In: 710 [P.O.:410; I.V.:300] Out: 750 [Urine:750]   Vitals:   04/05/16 2101 04/05/16 2201 04/05/16 2230 04/05/16 2301  BP: (!) 145/103 (!) 140/98  129/87  Pulse: 98 90  90  Resp: 18 18  18   Temp:   98.3 F (36.8 C)   TempSrc:   Oral   SpO2:      Weight:      Height:        FHT: Category 1 UC:   Occasional, mild SVE:   Dilation: Closed Effacement (%): 50 Station: -2 Exam by:: Manfred ArchV Tali Cleaves, CNM 2nd dose Cytotech 25 mcg placed in posterior fornix  Assessment:  Induction for pre-eclampsia with severe features  Plan: Continue cervical ripening Close observation of maternal/fetal status.  Nigel BridgemanLATHAM, Kieryn Burtis CNM 04/05/2016, 11:59 PM

## 2016-04-05 NOTE — Progress Notes (Signed)
Hospital day # 13 pregnancy at 7383w4d:   25 y.o.G1 admitted with preeclampsia with severe features. She has received betamethasone. TakingProcardia XL 120. Last ultrasound 8/24: vertex presentation, AGA.   S: well, reports good fetal activity. Denies PIH symptoms. Retrosternal pain greatly improved.      Contractions:none      Vaginal bleeding:none now       Vaginal discharge: no significant change  O: BP (!) 154/107   Pulse 97   Temp 98.7 F (37.1 C) (Oral)   Resp 16   Ht 5' 5.5" (1.664 m)   Wt 190 lb 14.4 oz (86.6 kg)   LMP 07/14/2015 (Exact Date)   SpO2 98%   BMI 31.28 kg/m    Normal PIH labs yesterday       Fetal tracings:reviewed and reassuring      Uterus gravid and non-tender      Extremities: no significant edema and no signs of DVT DTR brisk No clonus  A: 7483w4d with pre-eclampsia with severe features     stable  P: Continue in-hospital management.  Check PIH labs daily  ADD PROTONIX FOR SUSPECTED GERD. PATIENT INFORMED OF POSSIBILITY OF NEEDING IOL FOR WORSENING PIH  3 hour GTT: normal  See MFM recommendations listed below for appropriate indications for delivery:  Recommendations:  1) Would start antihypertensive medications. My first choice would be Procardia XL. Would start out at 30 mg daily and titrate upward to achieve target blood pressures in the 140/90 range. The maximum dose would be 120 mg total dose daily. 2) Recommend preeclampsia labs at least 2x weekly (Monday/Thursday or Tuesday/Friday) or more frequently if any significant clinical change occurs 3) At least daily fetal strips or more frequently as clinically indicated 4) Would start Magnesium sulfate and move toward delivery for: 1) LFTS > 2x normal 2) Creat > 1.2 3) Platelets < 100 4) Severe range blood pressures (SBP at or > 160 or DBP at or > 110) despite antihypertensive medications 5) Severe headache that dose not improve with medications 6) Concerns over fetal testing 5)  Recommend serial ultrasounds for growth every 3-4 weeks 7) Delivery by [redacted] weeks gestation due to preeclampsia with severe features if otherwise stable and not meeting criteria for earlier delivery 8) Would recommend NICU consultation due to likely preterm delivery  Sam Wunschel A  MD 04/05/2016 9:37 AM

## 2016-04-05 NOTE — Progress Notes (Signed)
CALLED BY NURSE FOR WORSENING BP REQUIRING MULTIPLE DOSES OF LABETALOL AND 1 DOSE OF APRESOLINE  S: Patient reports persistent epigastric pain since last night but less intense. She denies headache or visual symptoms. She has noticed a significant increase in her swelling. Good FM  O: Anxious but in no acute distress      Cardio: normal rate and rhythm, no murmur      Lungs: clear      Extremities: 2+ pitting edema, DTR very brisk and 2 beat clonus de novo      Bedside ultrasound: confirms vertex presentation      Fetal tracing: Category 1  A: Pre-eclampsia with severe features now with persistent high range BP despite maxed Procardia and IV rescue antihypertensive  P: Recommend to proceed with IOL: procedure reviewed and questions answered      Stat PIH labs      Magnesium sulfate 4 g bolus followed by 2g/hr      NICU informed of plan of care      Patient and family aware of increased risk of cesarean delivery

## 2016-04-05 NOTE — Anesthesia Pain Management Evaluation Note (Signed)
  CRNA Pain Management Visit Note  Patient: Charlotte Walton, 25 y.o., female  "Hello I am a [facility] the anesthesia [facility]. We have an anesthesia team available at all times to provide care throughout the hospital, including epidural management and anesthesia for C-section. I don't know your plan for the delivery whether it a natural birth, water birth, IV sedation, nitrous supplementation, doula or epidural, but we want to meet your pain goals."   1.Was your pain managed to your expectations on prior hospitalizations?   No prior hospitalizations  2.What is your expectation for pain management during this hospitalization?     Epidural, IV pain meds and Nitrous Oxide  3.How can we help you reach that goal? Eventual epidural  Record the patient's initial score and the patient's pain goal.   Pain: 0  Pain Goal: 3 [facility] wants you to be able to say your pain was always managed very well.  Edahi Kroening 04/05/2016

## 2016-04-05 NOTE — Progress Notes (Addendum)
  Subjective: Now on Birthing Suite, prepped for induction.  Mother and FOB at bedside.  Reports mild HA, slight epigastric pain as before, no visual sx.  Objective: BP (!) 140/93   Pulse (!) 102   Temp 98.2 F (36.8 C) (Oral)   Resp 18   Ht 5' 5.5" (1.664 m)   Wt 86.6 kg (190 lb 14.4 oz)   LMP 07/14/2015 (Exact Date)   SpO2 98%   BMI 31.28 kg/m  I/O last 3 completed shifts: In: 1434.2 [P.O.:1290; I.V.:144.2] Out: 0  No intake/output data recorded.   Vitals:   04/05/16 1815 04/05/16 1816 04/05/16 1835 04/05/16 1904  BP: (!) 157/106  (!) 156/102 (!) 140/93  Pulse: (!) 110 (!) 109 (!) 115 (!) 102  Resp:   16 18  Temp:   98.2 F (36.8 C)   TempSrc:   Oral   SpO2: 98%     Weight:      Height:       Magnesium infusion begun 1744.  CBC Latest Ref Rng & Units 04/05/2016 04/05/2016 04/03/2016  WBC 4.0 - 10.5 K/uL 16.4(H) 14.5(H) 14.0(H)  Hemoglobin 12.0 - 15.0 g/dL 16.112.1 11.9(L) 11.0(L)  Hematocrit 36.0 - 46.0 % 34.3(L) 34.2(L) 31.3(L)  Platelets 150 - 400 K/uL 190 199 200   CMP Latest Ref Rng & Units 04/05/2016 04/05/2016 04/04/2016  Glucose 65 - 99 mg/dL 096(E103(H) 91 79  BUN 6 - 20 mg/dL 16 15 -  Creatinine 4.540.44 - 1.00 mg/dL 0.980.88 1.190.77 -  Sodium 147135 - 145 mmol/L 131(L) 137 -  Potassium 3.5 - 5.1 mmol/L 4.1 4.6 -  Chloride 101 - 111 mmol/L 104 108 -  CO2 22 - 32 mmol/L 21(L) 23 -  Calcium 8.9 - 10.3 mg/dL 8.2(N8.5(L) 5.6(O8.4(L) -  Total Protein 6.5 - 8.1 g/dL 1.3(Y5.6(L) 5.1(L) -  Total Bilirubin 0.3 - 1.2 mg/dL 0.6 0.6 -  Alkaline Phos 38 - 126 U/L 119 105 -  AST 15 - 41 U/L 63(H) 38 -  ALT 14 - 54 U/L 55(H) 36 -    FHT: Category 1 UC:   Mild, irregular SVE:   Dilation: Closed Effacement (%): 50 Station: -2 Exam by:: Manfred ArchV Adil Tugwell, CNM  Cytotech 25 mcg #1 dose placed in posterior fornix.  Assessment:  IUP at 30 4/7 weeks Pre-eclampsia with severe features, persistent elevated BP and epigastric pain GBS negative by PCR from 03/23/16 S/p betamethasone course 8/23 and 8/24   Plan: Reviewed  dx of pre-eclampsia with severe features, with worsening status, and plan for induction.  Questions from patient and family reviewed. Risks and benefits of induction were reviewed, including failure of method, prolonged labor, need for further intervention, risk of cesarean.  Patient and family seem to understand these risks and wish to proceed. Options of cytotech, foley bulb, AROM, and pitocin reviewed, with use of each discussed. Will proceed with Cytotech as initial mode of induction. If BP remains stable, will allow light meal while on Cytotech. If seizure activity occurs, plan treatment with Valium 5-10 mg IV. Repeat PIH labs in am--earlier if condition worsens. Support to patient and family for concerns and anxieties.  Nigel BridgemanLATHAM, Binyamin Nelis CNM 04/05/2016, 7:53 PM

## 2016-04-05 NOTE — Progress Notes (Signed)
Called by RN regarding patient with "stomach pain".  Denies HA, visual sx. Denies N/V, diarrhea or constipation.  Vitals:   04/04/16 1611 04/04/16 1612 04/04/16 2159 04/04/16 2322  BP: (!) 149/115 (!) 136/96 (!) 141/100 (!) 146/105  Pulse: (!) 109 (!) 107 94 (!) 104  Resp:  16 16 16   Temp:   98 F (36.7 C) 98.7 F (37.1 C)  TempSrc:   Oral Oral  SpO2:      Weight:      Height:       On Procardia 120 mg XL since yesterday.  Has not met parameters in last 24 hours for IV antihypertensives.  FHR monitored TID--reassuring. Single elevated value on 3 hour GTT.  PE: Chest clear Heart RRR without murmur Abd gravid, mild tenderness in fundal and sternal region, no RUQ tenderness Pelvic--deferred Ext--DTR 2+, no clonus, no edema.  Will give Percocet ordered for prn use. [place] labs STAT.  Close observation, await lab results.  Donnel Saxon, CNM 04/05/16 5:41a  Addendum: Results for orders placed or performed during the hospital encounter of 03/22/16 (from the past 24 hour(s))  Glucose, 2 hour gestational     Status: None   Collection Time: 04/04/16  7:56 AM  Result Value Ref Range   Glucose, 2 Hour-Gestational 155 70 - 164 mg/dL  Glucose, 3 hour gestational     Status: Abnormal   Collection Time: 04/04/16  8:52 AM  Result Value Ref Range   Glucose, GTT - 3 Hour 158 (H) 70 - 144 mg/dL  CBC     Status: Abnormal   Collection Time: 04/05/16  5:56 AM  Result Value Ref Range   WBC 14.5 (H) 4.0 - 10.5 K/uL   RBC 3.90 3.87 - 5.11 MIL/uL   Hemoglobin 11.9 (L) 12.0 - 15.0 g/dL   HCT 34.2 (L) 36.0 - 46.0 %   MCV 87.7 78.0 - 100.0 fL   MCH 30.5 26.0 - 34.0 pg   MCHC 34.8 30.0 - 36.0 g/dL   RDW 13.5 11.5 - 15.5 %   Platelets 199 150 - 400 K/uL  Comprehensive metabolic panel     Status: Abnormal   Collection Time: 04/05/16  5:56 AM  Result Value Ref Range   Sodium 137 135 - 145 mmol/L   Potassium 4.6 3.5 - 5.1 mmol/L   Chloride 108 101 - 111 mmol/L   CO2 23 22 - 32 mmol/L   Glucose, Bld 91 65 - 99 mg/dL   BUN 15 6 - 20 mg/dL   Creatinine, Ser 0.77 0.44 - 1.00 mg/dL   Calcium 8.4 (L) 8.9 - 10.3 mg/dL   Total Protein 5.1 (L) 6.5 - 8.1 g/dL   Albumin 2.0 (L) 3.5 - 5.0 g/dL   AST 38 15 - 41 U/L   ALT 36 14 - 54 U/L   Alkaline Phosphatase 105 38 - 126 U/L   Total Bilirubin 0.6 0.3 - 1.2 mg/dL   GFR calc non Af Amer >60 >60 mL/min   GFR calc Af Amer >60 >60 mL/min   Anion gap 6 5 - 15  Lactate dehydrogenase     Status: None   Collection Time: 04/05/16  5:56 AM  Result Value Ref Range   LDH 188 98 - 192 U/L  Uric acid     Status: Abnormal   Collection Time: 04/05/16  5:56 AM  Result Value Ref Range   Uric Acid, Serum 6.8 (H) 2.3 - 6.6 mg/dL   Previous Uric Acid 6.4 Previous AST/ALT  26/24.  Will consult with Dr. Cletis Media for further plans.  Davis Gourd 04/05/16 6:55a

## 2016-04-06 LAB — COMPREHENSIVE METABOLIC PANEL
ALK PHOS: 103 U/L (ref 38–126)
ALT: 50 U/L (ref 14–54)
ANION GAP: 6 (ref 5–15)
AST: 50 U/L — ABNORMAL HIGH (ref 15–41)
Albumin: 2.1 g/dL — ABNORMAL LOW (ref 3.5–5.0)
BUN: 14 mg/dL (ref 6–20)
CALCIUM: 8.2 mg/dL — AB (ref 8.9–10.3)
CO2: 22 mmol/L (ref 22–32)
Chloride: 107 mmol/L (ref 101–111)
Creatinine, Ser: 0.9 mg/dL (ref 0.44–1.00)
Glucose, Bld: 85 mg/dL (ref 65–99)
Potassium: 4.5 mmol/L (ref 3.5–5.1)
SODIUM: 135 mmol/L (ref 135–145)
TOTAL PROTEIN: 5.8 g/dL — AB (ref 6.5–8.1)
Total Bilirubin: 0.4 mg/dL (ref 0.3–1.2)

## 2016-04-06 LAB — CBC
HCT: 33.8 % — ABNORMAL LOW (ref 36.0–46.0)
HCT: 34.6 % — ABNORMAL LOW (ref 36.0–46.0)
HEMOGLOBIN: 11.9 g/dL — AB (ref 12.0–15.0)
HEMOGLOBIN: 12.1 g/dL (ref 12.0–15.0)
MCH: 30.5 pg (ref 26.0–34.0)
MCH: 30.6 pg (ref 26.0–34.0)
MCHC: 35.2 g/dL (ref 30.0–36.0)
MCHC: 35 g/dL (ref 30.0–36.0)
MCV: 86.7 fL (ref 78.0–100.0)
MCV: 87.6 fL (ref 78.0–100.0)
Platelets: 173 10*3/uL (ref 150–400)
Platelets: 179 10*3/uL (ref 150–400)
RBC: 3.9 MIL/uL (ref 3.87–5.11)
RBC: 3.95 MIL/uL (ref 3.87–5.11)
RDW: 13.6 % (ref 11.5–15.5)
RDW: 13.6 % (ref 11.5–15.5)
WBC: 15 10*3/uL — ABNORMAL HIGH (ref 4.0–10.5)
WBC: 16 10*3/uL — ABNORMAL HIGH (ref 4.0–10.5)

## 2016-04-06 LAB — MAGNESIUM: MAGNESIUM: 7.9 mg/dL — AB (ref 1.7–2.4)

## 2016-04-06 MED ORDER — MAGNESIUM SULFATE 50 % IJ SOLN
2.0000 g/h | INTRAVENOUS | Status: DC
  Filled 2016-04-06: qty 80

## 2016-04-06 MED ORDER — LACTATED RINGERS IV SOLN: 2.0000 g/h | INTRAVENOUS | Status: DC

## 2016-04-06 MED ORDER — BUTORPHANOL TARTRATE 1 MG/ML IJ SOLN
1.0000 mg | INTRAMUSCULAR | Status: DC | PRN
  Administered 2016-04-06 – 2016-04-07 (×2): 1 mg via INTRAVENOUS
  Filled 2016-04-06: qty 1

## 2016-04-06 MED ORDER — LACTATED RINGERS IV SOLN: INTRAVENOUS | Status: DC

## 2016-04-06 MED ORDER — OXYTOCIN 40 UNITS IN LACTATED RINGERS INFUSION - SIMPLE MED
1.0000 m[IU]/min | INTRAVENOUS | Status: DC
  Administered 2016-04-06: 2 m[IU]/min via INTRAVENOUS
  Administered 2016-04-08: 1 m[IU]/min via INTRAVENOUS
  Filled 2016-04-06: qty 1000

## 2016-04-06 MED ORDER — ENOXAPARIN SODIUM 30 MG/0.3ML ~~LOC~~ SOLN: 30.0000 mg | Freq: Two times a day (BID) | SUBCUTANEOUS | Status: DC

## 2016-04-06 MED ORDER — LABETALOL HCL 5 MG/ML IV SOLN
INTRAVENOUS | Status: AC
  Filled 2016-04-06: qty 8

## 2016-04-06 MED ORDER — MAGNESIUM SULFATE BOLUS VIA INFUSION: 4.0000 g | Freq: Once | INTRAVENOUS | Status: DC

## 2016-04-06 MED ORDER — HYDRALAZINE HCL 20 MG/ML IJ SOLN: 10.0000 mg | Freq: Once | INTRAMUSCULAR | Status: DC | PRN

## 2016-04-06 MED ORDER — LABETALOL HCL 5 MG/ML IV SOLN
20.0000 mg | INTRAVENOUS | Status: AC | PRN
  Administered 2016-04-06: 80 mg via INTRAVENOUS
  Administered 2016-04-06: 20 mg via INTRAVENOUS
  Administered 2016-04-06: 40 mg via INTRAVENOUS
  Filled 2016-04-06: qty 4
  Filled 2016-04-06: qty 16

## 2016-04-06 MED ORDER — OXYTOCIN 40 UNITS IN LACTATED RINGERS INFUSION - SIMPLE MED
INTRAVENOUS | Status: AC
  Filled 2016-04-06: qty 1000

## 2016-04-06 MED ORDER — BUTORPHANOL TARTRATE 1 MG/ML IJ SOLN
INTRAMUSCULAR | Status: AC
  Filled 2016-04-06: qty 1

## 2016-04-06 MED ORDER — HYDRALAZINE HCL 20 MG/ML IJ SOLN
10.0000 mg | Freq: Once | INTRAMUSCULAR | Status: DC | PRN
Start: 2016-04-06 — End: 2016-04-08

## 2016-04-06 MED ORDER — TERBUTALINE SULFATE 1 MG/ML IJ SOLN: 0.2500 mg | Freq: Once | INTRAMUSCULAR | Status: DC | PRN

## 2016-04-06 NOTE — Progress Notes (Signed)
Charlotte Walton is a 25 y.o. G1P0000 at 9830w5dadmitted for induction of labor due to severe preeclampsia @ 30+[redacted] weeks gestation Subjective:  Comfortable with  IV pain meds Contractions:none palpable Objective: BP (!) 155/113   Pulse 88   Temp 98.6 F (37 C) (Oral)   Resp 16   Ht 5' 5.5" (1.664 m)   Wt 190 lb 14.4 oz (86.6 kg)   LMP 07/14/2015 (Exact Date)   SpO2 98%   BMI 31.28 kg/m  I/O last 3 completed shifts: In: 3213.8 [P.O.:1565; I.V.:1648.8] Out: 5075 [Urine:5075] Total I/O In: 0  Out: 450 [Urine:450]  FHT:  FHR: 140  bpm, variability: moderate,  accelerations:  Abscent,  decelerations:  Absent MVU:  SVE:   Dilation: Closed Effacement (%): 60 Station: -3 Exam by:: lee  Labs: Lab Results  Component Value Date   WBC 16.0 (H) 04/06/2016   HGB 12.1 04/06/2016   HCT 34.6 (L) 04/06/2016   MCV 87.6 04/06/2016   PLT 179 04/06/2016   Results for orders placed or performed during the hospital encounter of 03/22/16 (from the past 24 hour(s))  CBC     Status: Abnormal   Collection Time: 04/06/16  6:20 AM  Result Value Ref Range   WBC 15.0 (H) 4.0 - 10.5 K/uL   RBC 3.90 3.87 - 5.11 MIL/uL   Hemoglobin 11.9 (L) 12.0 - 15.0 g/dL   HCT 56.233.8 (L) 13.036.0 - 86.546.0 %   MCV 86.7 78.0 - 100.0 fL   MCH 30.5 26.0 - 34.0 pg   MCHC 35.2 30.0 - 36.0 g/dL   RDW 78.413.6 69.611.5 - 29.515.5 %   Platelets 173 150 - 400 K/uL  Comprehensive metabolic panel     Status: Abnormal   Collection Time: 04/06/16  6:20 AM  Result Value Ref Range   Sodium 135 135 - 145 mmol/L   Potassium 4.5 3.5 - 5.1 mmol/L   Chloride 107 101 - 111 mmol/L   CO2 22 22 - 32 mmol/L   Glucose, Bld 85 65 - 99 mg/dL   BUN 14 6 - 20 mg/dL   Creatinine, Ser 2.840.90 0.44 - 1.00 mg/dL   Calcium 8.2 (L) 8.9 - 10.3 mg/dL   Total Protein 5.8 (L) 6.5 - 8.1 g/dL   Albumin 2.1 (L) 3.5 - 5.0 g/dL   AST 50 (H) 15 - 41 U/L   ALT 50 14 - 54 U/L   Alkaline Phosphatase 103 38 - 126 U/L   Total Bilirubin 0.4 0.3 - 1.2 mg/dL   GFR calc non Af  Amer >60 >60 mL/min   GFR calc Af Amer >60 >60 mL/min   Anion gap 6 5 - 15  Magnesium     Status: Abnormal   Collection Time: 04/06/16  5:42 PM  Result Value Ref Range   Magnesium 7.9 (HH) 1.7 - 2.4 mg/dL  CBC     Status: Abnormal   Collection Time: 04/06/16  5:42 PM  Result Value Ref Range   WBC 16.0 (H) 4.0 - 10.5 K/uL   RBC 3.95 3.87 - 5.11 MIL/uL   Hemoglobin 12.1 12.0 - 15.0 g/dL   HCT 13.234.6 (L) 44.036.0 - 10.246.0 %   MCV 87.6 78.0 - 100.0 fL   MCH 30.6 26.0 - 34.0 pg   MCHC 35.0 30.0 - 36.0 g/dL   RDW 72.513.6 36.611.5 - 44.015.5 %   Platelets 179 150 - 400 K/uL   Assessment / Plan: Induction of labor due to preeclampsia,  progressing well on pitocin  Labetalol  dose given for elevated B/P 155/113 per protocol; Apresoline given per protocol. Pt is responding and talking when asked- Recheck Mag Level at 10pm. Pitocin @ 59mu/min Fetal Wellbeing: reassuring Anticipated MOD:  NSVD  Rily Nickey A Chasey Dull CNM 04/06/2016, 8:10 PM

## 2016-04-06 NOTE — Progress Notes (Signed)
  Subjective: Not aware of any cramping.  Reports HA 1/10, has slept at intervals.  Objective: BP (!) 134/101   Pulse 90   Temp 98.4 F (36.9 C) (Oral)   Resp 18   Ht 5' 5.5" (1.664 m)   Wt 86.6 kg (190 lb 14.4 oz)   LMP 07/14/2015 (Exact Date)   SpO2 98%   BMI 31.28 kg/m  I/O last 3 completed shifts: In: 1434.2 [P.O.:1290; I.V.:144.2] Out: 0  Total I/O In: 1280 [P.O.:530; I.V.:750] Out: 2100 [Urine:2100]   Vitals:   04/06/16 0417 04/06/16 0501 04/06/16 0601 04/06/16 0630  BP: (!) 151/109 (!) 148/104 (!) 134/101   Pulse: 92 98 90   Resp:  18 18   Temp:    98.4 F (36.9 C)  TempSrc:    Oral  SpO2:      Weight:      Height:       Chest clear Heart RRR without murmur Abd gravid, NT Ext DTR 2-3+, 1b clonus bilaterally, 1+ edema. FHT: Category 1 UC:   Very occassional SVE:   Dilation: Closed Effacement (%): 50 Station: -2 Exam by:: E Poore RNC at 0403 #3 Cytotech placed at that time  CBC Latest Ref Rng & Units 04/06/2016 04/05/2016 04/05/2016  WBC 4.0 - 10.5 K/uL 15.0(H) 16.4(H) 14.5(H)  Hemoglobin 12.0 - 15.0 g/dL 11.9(L) 12.1 11.9(L)  Hematocrit 36.0 - 46.0 % 33.8(L) 34.3(L) 34.2(L)  Platelets 150 - 400 K/uL 173 190 199   CMP pending from 0620  Magnesium at 2 gm/hr.  Assessment:  IUP at 30 5/7 weeks Pre-eclampsia with severe features Unfavorable cervix GBS negative  Plan: Continue current plan. Re-evaluate at 8am for next plan of care. Close observation of maternal/fetal status.  Charlotte Walton, Charlotte Walton CNM 04/06/2016, 6:46 AM

## 2016-04-06 NOTE — Progress Notes (Signed)
Dr Stefano Gaulstringer called aware that pt's bp 166/122 - pushed 20mg  of iv labetalol - after 10 min bp 159/115 - asked if he wanted to give labetalol or switch to apresoline - order to give 40 iv labetalol - also asked if he wanted to continue with cytotec or start pitocin - order to place 5th cytotec if no change in cervix

## 2016-04-06 NOTE — Progress Notes (Signed)
  Subjective: Slept at intervals.  Denies significant HA.  No visual sx or epigastric pain.  Objective: BP (!) 148/104   Pulse 98   Temp 97.9 F (36.6 C) (Oral)   Resp 18   Ht 5' 5.5" (1.664 m)   Wt 86.6 kg (190 lb 14.4 oz)   LMP 07/14/2015 (Exact Date)   SpO2 98%   BMI 31.28 kg/m  I/O last 3 completed shifts: In: 1434.2 [P.O.:1290; I.V.:144.2] Out: 0  Total I/O In: 1205 [P.O.:530; I.V.:675] Out: 1650 [Urine:1650]   Vitals:   04/06/16 0301 04/06/16 0401 04/06/16 0417 04/06/16 0501  BP: (!) 134/92 (!) 146/114 (!) 151/109 (!) 148/104  Pulse: 98 83 92 98  Resp:      Temp:      TempSrc:      SpO2:      Weight:      Height:       Magnesium at 2 gm/hr.  Output this shift 2100 cc  FHT: Category 1 UC:   Occasional, mild SVE:   Dilation: Closed Effacement (%): 50 Station: -2 Exam by:: Manfred ArchV Kamrynn Melott, CNM at 2356, with status unchanged at 4am by RN exam Cytotech 25 mcg #3 dose placed by RN in posterior fornix   Assessment:  Induction for pre-eclampsia with severe features Unfavorable cervix GBS negative S/p betamethasone 8/23 and 8/24  Plan: Continue current care--re-evaluate cervix at 8am for next plan of care. PIH labs pending this am.  Nigel BridgemanLATHAM, Neizan Debruhl CNM 04/06/2016, 5:37 AM

## 2016-04-06 NOTE — Progress Notes (Addendum)
25 y.o. year old female,at 6560w5d gestation.  SUBJECTIVE:  Comfortable with contractions.  OBJECTIVE:  BP (!) 147/107   Pulse 92   Temp 98 F (36.7 C) (Oral)   Resp 16   Ht 5' 5.5" (1.664 m)   Wt 190 lb 14.4 oz (86.6 kg)   LMP 07/14/2015 (Exact Date)   SpO2 98%   BMI 31.28 kg/m   Fetal Heart Tones:  Category 1  Contractions:          Mild  Cervix unchanged  Results for orders placed or performed during the hospital encounter of 03/22/16 (from the past 24 hour(s))  Comprehensive metabolic panel     Status: Abnormal   Collection Time: 04/05/16  5:47 PM  Result Value Ref Range   Sodium 131 (L) 135 - 145 mmol/L   Potassium 4.1 3.5 - 5.1 mmol/L   Chloride 104 101 - 111 mmol/L   CO2 21 (L) 22 - 32 mmol/L   Glucose, Bld 103 (H) 65 - 99 mg/dL   BUN 16 6 - 20 mg/dL   Creatinine, Ser 1.610.88 0.44 - 1.00 mg/dL   Calcium 8.5 (L) 8.9 - 10.3 mg/dL   Total Protein 5.6 (L) 6.5 - 8.1 g/dL   Albumin 2.1 (L) 3.5 - 5.0 g/dL   AST 63 (H) 15 - 41 U/L   ALT 55 (H) 14 - 54 U/L   Alkaline Phosphatase 119 38 - 126 U/L   Total Bilirubin 0.6 0.3 - 1.2 mg/dL   GFR calc non Af Amer >60 >60 mL/min   GFR calc Af Amer >60 >60 mL/min   Anion gap 6 5 - 15  CBC     Status: Abnormal   Collection Time: 04/05/16  5:47 PM  Result Value Ref Range   WBC 16.4 (H) 4.0 - 10.5 K/uL   RBC 3.91 3.87 - 5.11 MIL/uL   Hemoglobin 12.1 12.0 - 15.0 g/dL   HCT 09.634.3 (L) 04.536.0 - 40.946.0 %   MCV 87.7 78.0 - 100.0 fL   MCH 30.9 26.0 - 34.0 pg   MCHC 35.3 30.0 - 36.0 g/dL   RDW 81.113.4 91.411.5 - 78.215.5 %   Platelets 190 150 - 400 K/uL  CBC     Status: Abnormal   Collection Time: 04/06/16  6:20 AM  Result Value Ref Range   WBC 15.0 (H) 4.0 - 10.5 K/uL   RBC 3.90 3.87 - 5.11 MIL/uL   Hemoglobin 11.9 (L) 12.0 - 15.0 g/dL   HCT 95.633.8 (L) 21.336.0 - 08.646.0 %   MCV 86.7 78.0 - 100.0 fL   MCH 30.5 26.0 - 34.0 pg   MCHC 35.2 30.0 - 36.0 g/dL   RDW 57.813.6 46.911.5 - 62.915.5 %   Platelets 173 150 - 400 K/uL  Comprehensive metabolic panel      Status: Abnormal   Collection Time: 04/06/16  6:20 AM  Result Value Ref Range   Sodium 135 135 - 145 mmol/L   Potassium 4.5 3.5 - 5.1 mmol/L   Chloride 107 101 - 111 mmol/L   CO2 22 22 - 32 mmol/L   Glucose, Bld 85 65 - 99 mg/dL   BUN 14 6 - 20 mg/dL   Creatinine, Ser 5.280.90 0.44 - 1.00 mg/dL   Calcium 8.2 (L) 8.9 - 10.3 mg/dL   Total Protein 5.8 (L) 6.5 - 8.1 g/dL   Albumin 2.1 (L) 3.5 - 5.0 g/dL   AST 50 (H) 15 - 41 U/L   ALT 50 14 - 54 U/L  Alkaline Phosphatase 103 38 - 126 U/L   Total Bilirubin 0.4 0.3 - 1.2 mg/dL   GFR calc non Af Amer >60 >60 mL/min   GFR calc Af Amer >60 >60 mL/min   Anion gap 6 5 - 15     ASSESSMENT:  [redacted]w[redacted]d Weeks Pregnancy  Preeclampsia with severe features  No significant cervical change  Continued elevated blood pressures requiring IV antihypertensives  PLAN:  IV labetalol and Apresoline as needed.  We will give 1 additional dose of Cytotec and then begin Pitocin.  Leonard Schwartz, M.D. 04/06/2016 12:41 PM

## 2016-04-07 ENCOUNTER — Inpatient Hospital Stay (HOSPITAL_COMMUNITY): Payer: Managed Care, Other (non HMO) | Admitting: Anesthesiology

## 2016-04-07 LAB — MAGNESIUM
Magnesium: 6.9 mg/dL (ref 1.7–2.4)
Magnesium: 8.4 mg/dL (ref 1.7–2.4)
Magnesium: 6.6 mg/dL (ref 1.7–2.4)
Magnesium: 6.4 mg/dL (ref 1.7–2.4)

## 2016-04-07 LAB — CBC
HEMATOCRIT: 34.7 % — AB (ref 36.0–46.0)
HEMATOCRIT: 33.6 % — AB (ref 36.0–46.0)
HEMOGLOBIN: 11.9 g/dL — AB (ref 12.0–15.0)
Hemoglobin: 12.4 g/dL (ref 12.0–15.0)
MCH: 31.3 pg (ref 26.0–34.0)
MCH: 31 pg (ref 26.0–34.0)
MCHC: 35.7 g/dL (ref 30.0–36.0)
MCHC: 35.4 g/dL (ref 30.0–36.0)
MCV: 87.6 fL (ref 78.0–100.0)
MCV: 87.5 fL (ref 78.0–100.0)
Platelets: 194 10*3/uL (ref 150–400)
Platelets: 203 10*3/uL (ref 150–400)
RBC: 3.96 MIL/uL (ref 3.87–5.11)
RBC: 3.84 MIL/uL — AB (ref 3.87–5.11)
RDW: 13.8 % (ref 11.5–15.5)
RDW: 13.9 % (ref 11.5–15.5)
WBC: 17.5 10*3/uL — AB (ref 4.0–10.5)
WBC: 18.2 10*3/uL — ABNORMAL HIGH (ref 4.0–10.5)

## 2016-04-07 LAB — COMPREHENSIVE METABOLIC PANEL
ALK PHOS: 111 U/L (ref 38–126)
ALT: 52 U/L (ref 14–54)
ALT: 47 U/L (ref 14–54)
AST: 53 U/L — AB (ref 15–41)
AST: 51 U/L — ABNORMAL HIGH (ref 15–41)
Albumin: 2.1 g/dL — ABNORMAL LOW (ref 3.5–5.0)
Albumin: 2.2 g/dL — ABNORMAL LOW (ref 3.5–5.0)
Alkaline Phosphatase: 112 U/L (ref 38–126)
Anion gap: 9 (ref 5–15)
Anion gap: 8 (ref 5–15)
BUN: 16 mg/dL (ref 6–20)
BUN: 18 mg/dL (ref 6–20)
CALCIUM: 7.9 mg/dL — AB (ref 8.9–10.3)
CHLORIDE: 100 mmol/L — AB (ref 101–111)
CO2: 21 mmol/L — AB (ref 22–32)
CO2: 22 mmol/L (ref 22–32)
CREATININE: 1.13 mg/dL — AB (ref 0.44–1.00)
Calcium: 7.9 mg/dL — ABNORMAL LOW (ref 8.9–10.3)
Chloride: 103 mmol/L (ref 101–111)
Creatinine, Ser: 1.1 mg/dL — ABNORMAL HIGH (ref 0.44–1.00)
GFR calc Af Amer: >60 mL/min (ref >60)
Glucose, Bld: 98 mg/dL (ref 65–99)
Glucose, Bld: 99 mg/dL (ref 65–99)
POTASSIUM: 4 mmol/L (ref 3.5–5.1)
Potassium: 4.3 mmol/L (ref 3.5–5.1)
SODIUM: 130 mmol/L — AB (ref 135–145)
Sodium: 133 mmol/L — ABNORMAL LOW (ref 135–145)
Total Bilirubin: 0.4 mg/dL (ref 0.3–1.2)
Total Bilirubin: 0.7 mg/dL (ref 0.3–1.2)
Total Protein: 6.4 g/dL — ABNORMAL LOW (ref 6.5–8.1)
Total Protein: 6.4 g/dL — ABNORMAL LOW (ref 6.5–8.1)

## 2016-04-07 LAB — PROTEIN / CREATININE RATIO, URINE
Creatinine, Urine: 118 mg/dL
Protein Creatinine Ratio: 8.6 mg/mg{Cre} — ABNORMAL HIGH (ref 0.00–0.15)
Total Protein, Urine: 1015 mg/dL

## 2016-04-07 LAB — TYPE AND SCREEN
ABO/RH(D): AB POS
ANTIBODY SCREEN: NEGATIVE

## 2016-04-07 LAB — LACTATE DEHYDROGENASE: LDH: 217 U/L — ABNORMAL HIGH (ref 98–192)

## 2016-04-07 LAB — URIC ACID: Uric Acid, Serum: 9.5 mg/dL — ABNORMAL HIGH (ref 2.3–6.6)

## 2016-04-07 MED ORDER — LACTATED RINGERS IV SOLN: 500.0000 mL | Freq: Once | INTRAVENOUS | Status: DC

## 2016-04-07 MED ORDER — LABETALOL HCL 5 MG/ML IV SOLN
20.0000 mg | INTRAVENOUS | Status: DC | PRN
  Filled 2016-04-07: qty 4

## 2016-04-07 MED ORDER — PHENYLEPHRINE 40 MCG/ML (10ML) SYRINGE FOR IV PUSH (FOR BLOOD PRESSURE SUPPORT): 80.0000 ug | PREFILLED_SYRINGE | INTRAVENOUS | Status: DC | PRN

## 2016-04-07 MED ORDER — LACTATED RINGERS IV SOLN
500.0000 mL | Freq: Once | INTRAVENOUS | Status: AC
  Administered 2016-04-07: 500 mL via INTRAVENOUS

## 2016-04-07 MED ORDER — EPHEDRINE 5 MG/ML INJ: 10.0000 mg | INTRAVENOUS | Status: DC | PRN

## 2016-04-07 MED ORDER — FENTANYL 2.5 MCG/ML BUPIVACAINE 1/10 % EPIDURAL INFUSION (WH - ANES)
14.0000 mL/h | INTRAMUSCULAR | Status: DC | PRN
  Administered 2016-04-08 (×2): 14 mL/h via EPIDURAL

## 2016-04-07 MED ORDER — FENTANYL 2.5 MCG/ML BUPIVACAINE 1/10 % EPIDURAL INFUSION (WH - ANES)
14.0000 mL/h | INTRAMUSCULAR | Status: DC | PRN
  Administered 2016-04-07: 14 mL/h via EPIDURAL
  Filled 2016-04-07 (×3): qty 125

## 2016-04-07 MED ORDER — BUTALBITAL-APAP-CAFFEINE 50-325-40 MG PO TABS
2.0000 | ORAL_TABLET | Freq: Four times a day (QID) | ORAL | Status: DC | PRN
  Administered 2016-04-07: 2 via ORAL
  Filled 2016-04-07: qty 2

## 2016-04-07 MED ORDER — DIPHENHYDRAMINE HCL 50 MG/ML IJ SOLN: 12.5000 mg | INTRAMUSCULAR | Status: DC | PRN

## 2016-04-07 MED ORDER — PHENYLEPHRINE 40 MCG/ML (10ML) SYRINGE FOR IV PUSH (FOR BLOOD PRESSURE SUPPORT)
80.0000 ug | PREFILLED_SYRINGE | INTRAVENOUS | Status: DC | PRN
  Filled 2016-04-07: qty 10

## 2016-04-07 MED ORDER — LIDOCAINE HCL (PF) 1 % IJ SOLN
INTRAMUSCULAR | Status: DC | PRN
  Administered 2016-04-07 (×2): 5 mL

## 2016-04-07 MED ORDER — MAGNESIUM SULFATE 50 % IJ SOLN
1.0000 g/h | INTRAMUSCULAR | Status: DC
  Administered 2016-04-07 (×2): 1 g/h via INTRAVENOUS
  Filled 2016-04-07 (×2): qty 80

## 2016-04-07 NOTE — Progress Notes (Signed)
LABOR PROGRESS NOTE  Charlotte Walton is a 25 y.o. G1P0000 at 4244w6d  admitted for severe preeclampsia  Subjective: Pitocin dced. Variable decelerations that are persistent noted. Sign out to Dr. Estanislado Pandyivard. B'ps stable.  Objective: BP 125/86   Pulse 94   Temp 98.4 F (36.9 C)   Resp 16   Ht 5' 5.5" (1.664 m)   Wt 190 lb 14.4 oz (86.6 kg)   LMP 07/14/2015 (Exact Date)   SpO2 98%   BMI 31.28 kg/m  or  Vitals:   04/07/16 0401 04/07/16 0431 04/07/16 0501 04/07/16 0631  BP: (!) 132/98 (!) 119/95 (!) 135/92 125/86  Pulse: 99 97 96 94  Resp: 16  16 16   Temp:    98.4 F (36.9 C)  TempSrc:      SpO2:      Weight:      Height:         Dilation: Closed Effacement (%): Thick Cervical Position: Posterior Station: -3 Presentation: Vertex Exam by:: Burwell Bethel,CNM  Labs: Lab Results  Component Value Date   WBC 17.5 (H) 04/07/2016   HGB 12.4 04/07/2016   HCT 34.7 (L) 04/07/2016   MCV 87.6 04/07/2016   PLT 194 04/07/2016    Patient Active Problem List   Diagnosis Date Noted  . Preterm labor 04/06/2016  . Pre-eclampsia, severe, antepartum 03/23/2016  . History of irregular menstrual cycles 03/23/2016    Assessment / Plan: 25 y.o. G1P0000 at 3944w6d here for induction for severe preeclampsia  Morning labs pending   Fetal Wellbeing:  Cat 2  Anticipated MOD:  undetermined  Madolyn Ackroyd A Shai Rasmussen 04/07/2016, 6:56 AM

## 2016-04-07 NOTE — Progress Notes (Signed)
Subjective: Pt has just been increased to 2 gms/hr Magnesium. Pt given 120 Procardia XL and is on 7815mu/min Pitocin. She denies headache at this time. She states she is not feeling contractions. Attempted to place foley Bulb but cervix is tightly closed.   Objective:  I/O last 3 completed shifts: In: 3213.8 [P.O.:1565; I.V.:1648.8] Out: 5075 [Urine:5075] Total I/O In: 434.5 [P.O.:120; I.V.:314.5] Out: 1000 [Urine:1000] Reflexes 3+ No clonus B/P 138/82  FHT: prolonged variable which returned to baseline after position change (pt was on her back) UC:   irregular, SVE:   Dilation: Closed Effacement (%): 60 Station: -3 Exam by:: Lori Clemmons,CNM Pitocin at 3415mu/min MVUs   Assessment:  Induction of labor -severe preeclampsia 30 weeks 5 days  Plan: continue Mag 2gms/hr Continue pitocin Procardia 120XL daily Try to place foley bulb when dilated  Anticipate vaginal delivery  Lori A Clemmons CNM, [place] 04/07/2016, 12:44 AM

## 2016-04-07 NOTE — Progress Notes (Signed)
LABOR PROGRESS NOTE  Charlotte Walton is a 25 y.o. G1P0000 at 4123w6d  admitted for Severe preeclampsia  Subjective: Increased variable decelerations noted. Pitocin will be decreased by 1/2 strength. To 9mu.   Objective: BP (!) 135/92   Pulse 96   Temp 98.5 F (36.9 C) (Oral)   Resp 16   Ht 5' 5.5" (1.664 m)   Wt 190 lb 14.4 oz (86.6 kg)   LMP 07/14/2015 (Exact Date)   SpO2 98%   BMI 31.28 kg/m  or  Vitals:   04/07/16 0331 04/07/16 0401 04/07/16 0431 04/07/16 0501  BP: (!) 136/98 (!) 132/98 (!) 119/95 (!) 135/92  Pulse: 93 99 97 96  Resp: 16 16  16   Temp:      TempSrc:      SpO2:      Weight:      Height:         Dilation: Closed Effacement (%): Thick Cervical Position: Posterior Station: -3 Presentation: Vertex Exam by:: Varvara Legault,CNM  Labs: Lab Results  Component Value Date   WBC 17.5 (H) 04/07/2016   HGB 12.4 04/07/2016   HCT 34.7 (L) 04/07/2016   MCV 87.6 04/07/2016   PLT 194 04/07/2016    Patient Active Problem List   Diagnosis Date Noted  . Preterm labor 04/06/2016  . Pre-eclampsia, severe, antepartum 03/23/2016  . History of irregular menstrual cycles 03/23/2016    Assessment / Plan: 25 y.o. G1P0000 at 4123w6d here for Severe Preeclampsia Decreased pitocin Continue induction Pending labs   Lawson FiscalLori A Mica Releford 04/07/2016, 6:30 AM

## 2016-04-07 NOTE — Progress Notes (Signed)
LABOR PROGRESS NOTE  Charlotte Walton is a 25 y.o. G1P0000 at 6880w6d  admitted for Severe preeclampsia  Subjective: Pt is having prolonged decels of 1-2 minutes with return to baseline of 135-140. DC pitocin and readjust toco since it is not picking up contractions. Will restart pitocin when baby is reactive. Reported headache to charge RN.  Objective: BP (!) 151/102   Pulse (!) 101   Temp 98.3 F (36.8 C) (Oral)   Resp 16   Ht 5' 5.5" (1.664 m)   Wt 190 lb 14.4 oz (86.6 kg)   LMP 07/14/2015 (Exact Date)   SpO2 98%   BMI 31.28 kg/m  or  Vitals:   04/06/16 2319 04/06/16 2331 04/07/16 0001 04/07/16 0031  BP: 139/82 134/82 136/77 (!) 151/102  Pulse: 98 93 93 (!) 101  Resp: 16  16   Temp:      TempSrc:      SpO2:      Weight:      Height:         Dilation: Closed Effacement (%): 60 Cervical Position: Middle Station: -3 Presentation: Vertex Exam by:: Lori Clemmons,CNM  Labs: Lab Results  Component Value Date   WBC 16.0 (H) 04/06/2016   HGB 12.1 04/06/2016   HCT 34.6 (L) 04/06/2016   MCV 87.6 04/06/2016   PLT 179 04/06/2016  Mag Level 6.9  Patient Active Problem List   Diagnosis Date Noted  . Preterm labor 04/06/2016  . Pre-eclampsia, severe, antepartum 03/23/2016  . History of irregular menstrual cycles 03/23/2016    Assessment / Plan: 25 y.o. G1P0000 at 7480w6d here for Induction for severe preeclampsia  Labor: pitocin induction Fetal Wellbeing:  Cat 2 Pain Control:   Anticipated MOD:  SVD  Lori A Clemmons 04/07/2016, 12:57 AM

## 2016-04-07 NOTE — Progress Notes (Signed)
Patient ID: Racheal PatchesAmonie Walton, female   DOB: 1990-08-22, 25 y.o.   MRN: [id number] Pt c/o of headache BP (!) 144/105   Pulse 98   Temp 98.4 F (36.9 C)   Resp 16   Ht 5' 5.5" (1.664 m)   Wt 190 lb 12.8 oz (86.5 kg)   LMP 07/14/2015 (Exact Date)   SpO2 98%   BMI 31.27 kg/m  Cat 1 tracing Irregular contractions Pt offered CS but declined.  R&B reviewed Foley bulb placed.  During the placement a 200 cc old clot was removed from the cervix Mag level was 6.  Restart magnesium at 1 gram per hour fioricet for headache.  If no relief would recommend to proceed with delivery Will moniotr the pt very closely

## 2016-04-07 NOTE — Progress Notes (Signed)
Patient ID: Charlotte Walton, female   DOB: 1990-09-25, 25 y.o.   MRN: [id number] Pt without c/o.  No HA, blurred vision no RUQ pain BP (!) 146/106   Pulse (!) 102   Temp 97.9 F (36.6 C) (Axillary)   Resp 16   Ht 5' 5.5" (1.664 m)   Wt 190 lb 12.8 oz (86.5 kg)   LMP 07/14/2015 (Exact Date)   SpO2 98%   BMI 31.27 kg/m  Cat 1 cx 1/50/-3 Offered CS she declined Pt would like to try cytotec or foley again Stylus used to place the double balloon foley Labs are stable with adequate UO Mag level 6.4 will continue magnesium at 1gram/hr

## 2016-04-07 NOTE — Progress Notes (Signed)
LABOR PROGRESS NOTE  Charlotte Walton is a 25 y.o. G1P0000 at 8268w6d  admitted for severe preeclampsia @ 30+[redacted] weeks gestation  Subjective: B/P's much improved since midnight, Pt is not feeling contractions but is having them. Attempted another foley bulb placement but unable to place. Pitocin continues @ 18mu. With vag exam baby responds well with good variablity and accels. Continues to have occasional decelerations. Unable to trace contractions well but have a consistent irregular pattern.  Objective: BP (!) 135/92   Pulse 96   Temp 98.5 F (36.9 C) (Oral)   Resp 16   Ht 5' 5.5" (1.664 m)   Wt 190 lb 14.4 oz (86.6 kg)   LMP 07/14/2015 (Exact Date)   SpO2 98%   BMI 31.28 kg/m  or  Vitals:   04/07/16 0301 04/07/16 0331 04/07/16 0401 04/07/16 0501  BP: (!) 141/79 (!) 136/98 (!) 132/98 (!) 135/92  Pulse: 98 93 99 96  Resp: 16 16 16 16   Temp:      TempSrc:      SpO2:      Weight:      Height:       04/07/16 0501  --  96  --  16   [facility]92  [facility]  [facility]  [facility]   04/07/16 0401  --  99  --  16   132[facility]98  --  --  --  [facility]   04/07/16 0331  --  93  --  16   136/98  --  --  --  [facility]   04/07/16 0301  --  98  --  16   141/79  --  --  --  -- [facility]   04/07/16 0230  --  92  --  16  136/85  --  --  --  [facility]   04/07/16 0201  --  97  --  16   134/95  --  --  --  [facility]   04/07/16 0131  --  97  --  16   138/93  --  --  --  -- [facility]   04/07/16 0101  98.5 F (36.9 C)  100  --  16   144/109  --  --  --  -- [facility]   04/07/16 0031  --   101  --  --   151/102  --  --  --  -- [facility]   04/07/16 0001  --  93  --  16  136/77  --  --  --  -- [facility]       Dilation: Closed Effacement (%): Thick Cervical Position: Posterior Station: -3 Presentation: Vertex Exam by:: Aidah Forquer,CNM  Cervix is open FT  Labs: Lab Results  Component Value Date   WBC 16.0 (H) 04/06/2016   HGB 12.1 04/06/2016   HCT 34.6 (L) 04/06/2016   MCV 87.6 04/06/2016   PLT 179 04/06/2016    Patient Active Problem List   Diagnosis Date Noted    . Preterm labor 04/06/2016  . Pre-eclampsia, severe, antepartum 03/23/2016  . History of irregular menstrual cycles 03/23/2016    Assessment / Plan: 25 y.o. G1P0000 at 468w6d here for severe preeclampsia Pending AM labs Labor: pitocin induction Fetal Wellbeing:  Category 2  Anticipated MOD:  NSVD  Dorsie Burich A Yevonne Yokum 04/07/2016, 5:51 AM

## 2016-04-07 NOTE — Anesthesia Procedure Notes (Signed)
Epidural Patient location during procedure: OB Start time: 04/07/2016 4:51 PM End time: 04/07/2016 4:55 PM  Staffing Anesthesiologist: Bonita QuinGUIDETTI, Charlotte Opperman S Performed: anesthesiologist   Preanesthetic Checklist Completed: patient identified, site marked, surgical consent, pre-op evaluation, timeout performed, IV checked, risks and benefits discussed and monitors and equipment checked  Epidural Patient position: sitting Prep: site prepped and draped and DuraPrep Patient monitoring: continuous pulse ox and blood pressure Approach: midline Location: L3-L4 Injection technique: LOR air  Needle:  Needle type: Tuohy  Needle gauge: 17 G Needle length: 9 cm and 9 Needle insertion depth: 6 cm Catheter type: closed end flexible Catheter size: 19 Gauge Catheter at skin depth: 11 cm Test dose: negative  Assessment Events: blood not aspirated, injection not painful, no injection resistance, negative IV test and no paresthesia

## 2016-04-07 NOTE — Progress Notes (Signed)
  Subjective: Comfortable with epidural.  Denies HA, visual sx, or epigastric pain.  Mother and FOB at bedside.  Objective: BP 137/88   Pulse 95   Temp 98.5 F (36.9 C) (Oral)   Resp 16   Ht 5' 5.5" (1.664 m)   Wt 86.5 kg (190 lb 12.8 oz)   LMP 07/14/2015 (Exact Date)   SpO2 98%   BMI 31.27 kg/m  I/O last 3 completed shifts: In: 3229.5 [P.O.:1388; I.V.:1841.5] Out: 5805 [Urine:5805] No intake/output data recorded.   Vitals:   04/07/16 1801 04/07/16 1831 04/07/16 1901 04/07/16 1931  BP: 123/75 122/75 (!) 133/91 137/88  Pulse: (!) 109 91 97 95  Resp: 14 14 16    Temp:      TempSrc:      SpO2:      Weight:      Height:       Magnesium on 1 gm/hr since 1348--had been turned off x 4 hours due to mag level of 8.4 on morning labs  FHT: Category 1 UC:   regular, every 6 minutes, mild SVE:   Dilation: 1 Effacement (%): Thick Station: Ballotable Exam by:: S Nix RN at [id number] Foley bulb (Cook double balloon) placed at 1045.  Assessment:  IUP at 30 6/7 weeks with pre-eclampsia with severe features. Induction due to persistent elevations of BP GBS negative Unfavorable cervix--s/p Cytotech x 5 doses, now with foley bulb.  Plan: Continue current care Per consult with Dr. Su Hiltoberts, plan d/c foley at 2am if not extruded before, then start pitocin. Repeat PIH labs and mag level at 3am. Patient understands we are closely evaluating maternal/fetal status, and that if baby does not tolerate induction process, C/S will be required.  Nigel BridgemanLATHAM, Page Lancon CNM 04/07/2016, 7:59 PM

## 2016-04-07 NOTE — Anesthesia Preprocedure Evaluation (Signed)
Anesthesia Evaluation  Patient identified by MRN, date of birth, ID band Patient awake    Reviewed: Allergy & Precautions, NPO status , Patient's Chart, lab work & pertinent test results  Airway Mallampati: II  TM Distance: >3 FB Neck ROM: Full    Dental no notable dental hx.    Pulmonary neg pulmonary ROS,    Pulmonary exam normal        Cardiovascular hypertension, Normal cardiovascular exam     Neuro/Psych negative neurological ROS  negative psych ROS   GI/Hepatic negative GI ROS, Neg liver ROS,   Endo/Other  negative endocrine ROS  Renal/GU negative Renal ROS  negative genitourinary   Musculoskeletal negative musculoskeletal ROS (+)   Abdominal   Peds negative pediatric ROS (+)  Hematology negative hematology ROS (+)   Anesthesia Other Findings Pre-eclampsia  Reproductive/Obstetrics (+) Pregnancy                             Anesthesia Physical Anesthesia Plan  ASA: III  Anesthesia Plan: Epidural   Post-op Pain Management:    Induction:   Airway Management Planned:   Additional Equipment:   Intra-op Plan:   Post-operative Plan:   Informed Consent:   Plan Discussed with:   Anesthesia Plan Comments:         Anesthesia Quick Evaluation

## 2016-04-08 ENCOUNTER — Encounter (HOSPITAL_COMMUNITY): Payer: Self-pay | Admitting: *Deleted

## 2016-04-08 DIAGNOSIS — O1415 Severe pre-eclampsia, complicating the puerperium: Secondary | ICD-10-CM | POA: Diagnosis present

## 2016-04-08 LAB — COMPREHENSIVE METABOLIC PANEL
ALT: 42 U/L (ref 14–54)
ANION GAP: 7 (ref 5–15)
AST: 40 U/L (ref 15–41)
Albumin: 2 g/dL — ABNORMAL LOW (ref 3.5–5.0)
Alkaline Phosphatase: 106 U/L (ref 38–126)
BUN: 20 mg/dL (ref 6–20)
CHLORIDE: 101 mmol/L (ref 101–111)
CO2: 24 mmol/L (ref 22–32)
CREATININE: 1.18 mg/dL — AB (ref 0.44–1.00)
Calcium: 7.5 mg/dL — ABNORMAL LOW (ref 8.9–10.3)
Glucose, Bld: 87 mg/dL (ref 65–99)
POTASSIUM: 4.2 mmol/L (ref 3.5–5.1)
SODIUM: 132 mmol/L — AB (ref 135–145)
Total Bilirubin: 0.6 mg/dL (ref 0.3–1.2)
Total Protein: 6.1 g/dL — ABNORMAL LOW (ref 6.5–8.1)

## 2016-04-08 LAB — CBC
HCT: 32.6 % — ABNORMAL LOW (ref 36.0–46.0)
HCT: 32.2 % — ABNORMAL LOW (ref 36.0–46.0)
HEMATOCRIT: 32.5 % — AB (ref 36.0–46.0)
HEMOGLOBIN: 11.3 g/dL — AB (ref 12.0–15.0)
HEMOGLOBIN: 11.2 g/dL — AB (ref 12.0–15.0)
Hemoglobin: 11.4 g/dL — ABNORMAL LOW (ref 12.0–15.0)
MCH: 30.5 pg (ref 26.0–34.0)
MCH: 31 pg (ref 26.0–34.0)
MCH: 30.4 pg (ref 26.0–34.0)
MCHC: 35 g/dL (ref 30.0–36.0)
MCHC: 35.1 g/dL (ref 30.0–36.0)
MCHC: 34.5 g/dL (ref 30.0–36.0)
MCV: 87.2 fL (ref 78.0–100.0)
MCV: 88.5 fL (ref 78.0–100.0)
MCV: 88.3 fL (ref 78.0–100.0)
PLATELETS: 210 10*3/uL (ref 150–400)
PLATELETS: 208 10*3/uL (ref 150–400)
Platelets: 205 10*3/uL (ref 150–400)
RBC: 3.74 MIL/uL — AB (ref 3.87–5.11)
RBC: 3.64 MIL/uL — AB (ref 3.87–5.11)
RBC: 3.68 MIL/uL — AB (ref 3.87–5.11)
RDW: 13.9 % (ref 11.5–15.5)
RDW: 13.6 % (ref 11.5–15.5)
RDW: 13.5 % (ref 11.5–15.5)
WBC: 15.1 10*3/uL — ABNORMAL HIGH (ref 4.0–10.5)
WBC: 18.9 10*3/uL — AB (ref 4.0–10.5)
WBC: 21.8 10*3/uL — ABNORMAL HIGH (ref 4.0–10.5)

## 2016-04-08 LAB — CREATININE, SERUM: CREATININE: 1.24 mg/dL — AB (ref 0.44–1.00)

## 2016-04-08 LAB — URIC ACID: URIC ACID, SERUM: 11.1 mg/dL — AB (ref 2.3–6.6)

## 2016-04-08 LAB — MAGNESIUM: MAGNESIUM: 6.8 mg/dL — AB (ref 1.7–2.4)

## 2016-04-08 LAB — LACTATE DEHYDROGENASE: LDH: 204 U/L — AB (ref 98–192)

## 2016-04-08 MED ORDER — NALBUPHINE HCL 10 MG/ML IJ SOLN: 5.0000 mg | INTRAMUSCULAR | Status: DC | PRN

## 2016-04-08 MED ORDER — PRENATAL MULTIVITAMIN CH
1.0000 | ORAL_TABLET | Freq: Every day | ORAL | Status: DC
  Administered 2016-04-09 – 2016-04-12 (×4): 1 via ORAL
  Filled 2016-04-08 (×5): qty 1

## 2016-04-08 MED ORDER — COCONUT OIL OIL
1.0000 "application " | TOPICAL_OIL | Status: DC | PRN
  Filled 2016-04-08: qty 120

## 2016-04-08 MED ORDER — NALOXONE HCL 0.4 MG/ML IJ SOLN: 0.4000 mg | INTRAMUSCULAR | Status: DC | PRN

## 2016-04-08 MED ORDER — ONDANSETRON HCL 4 MG/2ML IJ SOLN: 4.0000 mg | INTRAMUSCULAR | Status: DC | PRN

## 2016-04-08 MED ORDER — NALBUPHINE HCL 10 MG/ML IJ SOLN: 5.0000 mg | Freq: Once | INTRAMUSCULAR | Status: DC | PRN

## 2016-04-08 MED ORDER — MEASLES, MUMPS & RUBELLA VAC ~~LOC~~ INJ: 0.5000 mL | INJECTION | Freq: Once | SUBCUTANEOUS | Status: DC

## 2016-04-08 MED ORDER — ACETAMINOPHEN 325 MG PO TABS: 650.0000 mg | ORAL_TABLET | ORAL | Status: DC | PRN

## 2016-04-08 MED ORDER — SENNOSIDES-DOCUSATE SODIUM 8.6-50 MG PO TABS
2.0000 | ORAL_TABLET | ORAL | Status: DC
  Administered 2016-04-08 – 2016-04-12 (×3): 2 via ORAL
  Filled 2016-04-08 (×4): qty 2

## 2016-04-08 MED ORDER — IBUPROFEN 600 MG PO TABS
600.0000 mg | ORAL_TABLET | Freq: Four times a day (QID) | ORAL | Status: DC
  Administered 2016-04-08 – 2016-04-12 (×15): 600 mg via ORAL
  Filled 2016-04-08 (×15): qty 1

## 2016-04-08 MED ORDER — BUPIVACAINE HCL (PF) 0.25 % IJ SOLN
INTRAMUSCULAR | Status: AC
  Filled 2016-04-08: qty 10

## 2016-04-08 MED ORDER — ENOXAPARIN SODIUM 40 MG/0.4ML ~~LOC~~ SOLN
40.0000 mg | SUBCUTANEOUS | Status: DC
  Administered 2016-04-09 – 2016-04-12 (×4): 40 mg via SUBCUTANEOUS
  Filled 2016-04-08 (×6): qty 0.4

## 2016-04-08 MED ORDER — ONDANSETRON HCL 4 MG PO TABS: 4.0000 mg | ORAL_TABLET | ORAL | Status: DC | PRN

## 2016-04-08 MED ORDER — ZOLPIDEM TARTRATE 5 MG PO TABS: 5.0000 mg | ORAL_TABLET | Freq: Every evening | ORAL | Status: DC | PRN

## 2016-04-08 MED ORDER — OXYTOCIN 40 UNITS IN LACTATED RINGERS INFUSION - SIMPLE MED: 1.0000 m[IU]/min | INTRAVENOUS | Status: DC

## 2016-04-08 MED ORDER — SCOPOLAMINE 1 MG/3DAYS TD PT72: 1.0000 | MEDICATED_PATCH | Freq: Once | TRANSDERMAL | Status: DC

## 2016-04-08 MED ORDER — TETANUS-DIPHTH-ACELL PERTUSSIS 5-2.5-18.5 LF-MCG/0.5 IM SUSP: 0.5000 mL | Freq: Once | INTRAMUSCULAR | Status: DC

## 2016-04-08 MED ORDER — FERROUS SULFATE 325 (65 FE) MG PO TABS
325.0000 mg | ORAL_TABLET | Freq: Two times a day (BID) | ORAL | Status: DC
  Administered 2016-04-09 – 2016-04-12 (×6): 325 mg via ORAL
  Filled 2016-04-08 (×4): qty 1

## 2016-04-08 MED ORDER — HYDRALAZINE HCL 20 MG/ML IJ SOLN: 10.0000 mg | Freq: Once | INTRAMUSCULAR | Status: DC | PRN

## 2016-04-08 MED ORDER — LACTATED RINGERS IV SOLN
INTRAVENOUS | Status: DC
  Administered 2016-04-09: 10:00:00 via INTRAVENOUS

## 2016-04-08 MED ORDER — DIBUCAINE 1 % RE OINT: 1.0000 "application " | TOPICAL_OINTMENT | RECTAL | Status: DC | PRN

## 2016-04-08 MED ORDER — WITCH HAZEL-GLYCERIN EX PADS: 1.0000 "application " | MEDICATED_PAD | CUTANEOUS | Status: DC | PRN

## 2016-04-08 MED ORDER — SODIUM CHLORIDE 0.9% FLUSH: 3.0000 mL | INTRAVENOUS | Status: DC | PRN

## 2016-04-08 MED ORDER — NALOXONE HCL 2 MG/2ML IJ SOSY: 1.0000 ug/kg/h | PREFILLED_SYRINGE | INTRAVENOUS | Status: DC | PRN

## 2016-04-08 MED ORDER — BENZOCAINE-MENTHOL 20-0.5 % EX AERO: 1.0000 "application " | INHALATION_SPRAY | CUTANEOUS | Status: DC | PRN

## 2016-04-08 MED ORDER — DIPHENHYDRAMINE HCL 50 MG/ML IJ SOLN: 12.5000 mg | INTRAMUSCULAR | Status: DC | PRN

## 2016-04-08 MED ORDER — DIPHENHYDRAMINE HCL 25 MG PO CAPS: 25.0000 mg | ORAL_CAPSULE | ORAL | Status: DC | PRN

## 2016-04-08 MED ORDER — LABETALOL HCL 5 MG/ML IV SOLN
INTRAVENOUS | Status: AC
  Filled 2016-04-08: qty 4

## 2016-04-08 MED ORDER — DIPHENHYDRAMINE HCL 25 MG PO CAPS
25.0000 mg | ORAL_CAPSULE | Freq: Four times a day (QID) | ORAL | Status: DC | PRN
  Filled 2016-04-08: qty 1

## 2016-04-08 MED ORDER — MEPERIDINE HCL 25 MG/ML IJ SOLN: 6.2500 mg | INTRAMUSCULAR | Status: DC | PRN

## 2016-04-08 MED ORDER — LIDOCAINE HCL (PF) 1 % IJ SOLN
INTRAMUSCULAR | Status: AC
  Filled 2016-04-08: qty 30

## 2016-04-08 MED ORDER — ONDANSETRON HCL 4 MG/2ML IJ SOLN: 4.0000 mg | Freq: Three times a day (TID) | INTRAMUSCULAR | Status: DC | PRN

## 2016-04-08 MED ORDER — LABETALOL HCL 5 MG/ML IV SOLN
20.0000 mg | INTRAVENOUS | Status: DC | PRN
  Administered 2016-04-08: 20 mg via INTRAVENOUS

## 2016-04-08 MED ORDER — SIMETHICONE 80 MG PO CHEW: 80.0000 mg | CHEWABLE_TABLET | ORAL | Status: DC | PRN

## 2016-04-08 NOTE — Progress Notes (Signed)
  Subjective: Comfortable with epidural.  Denies any PIH sx.  Objective: BP (!) 137/97 (BP Location: Right Arm)   Pulse 84   Temp 98.6 F (37 C) (Oral)   Resp 15   Ht 5' 5.5" (1.664 m)   Wt 86.5 kg (190 lb 12.8 oz)   LMP 07/14/2015 (Exact Date)   SpO2 98%   BMI 31.27 kg/m  I/O last 3 completed shifts: In: 3433.3 [P.O.:1733; I.V.:1700.3] Out: 3580 [Urine:3580] Total I/O In: -  Out: 100 [Urine:100]   Vitals:   04/08/16 0601 04/08/16 0631 04/08/16 0701 04/08/16 0730  BP: 132/79 117/72 140/90 (!) 137/97  Pulse: 78 77 84 84  Resp: 18 18 18 15   Temp:    98.6 F (37 C)  TempSrc:    Oral  SpO2:      Weight:      Height:        FHT: Category 2--series of 3-4 subtle late decels, resolved with position change.  Variability still decreased, + response to scalp stim. UC:   irregular, every 2-5 minutes SVE:   Dilation: 1.5 Effacement (%): 90 Station: Ballotable Exam by:: V Laurel Harnden CNM  BBOW--vtx still ballotable. Pitocin at 8 mu/min  Assessment:  Induction at 31 weeks for pre-eclampsia with severe features GBS negative Episode of Cat 2 FHR  Plan: Position change to facilitate fetal perfusion. Elected to defer AROM at present, due to ballotable position of vtx. Dr. Estanislado Pandyivard updated on status.  She will assume care. Advised patient we would be evaluating FHT status closely for determination if fetus will tolerate labor.  Charlotte Walton, Charlotte Walton CNM 04/08/2016, [phone]

## 2016-04-08 NOTE — Anesthesia Postprocedure Evaluation (Signed)
Anesthesia Post Note  Patient: Charlotte Walton  Procedure(s) Performed: * No procedures listed *  Patient location during evaluation: Mother Baby Anesthesia Type: Epidural Level of consciousness: awake and alert Pain management: satisfactory to patient Vital Signs Assessment: post-procedure vital signs reviewed and stable Respiratory status: respiratory function stable Cardiovascular status: stable Postop Assessment: no headache, no backache, epidural receding, patient able to bend at knees, no signs of nausea or vomiting and adequate PO intake Anesthetic complications: no     Last Vitals:  Vitals:   04/08/16 1931 04/08/16 2000  BP: (!) 155/105 (!) 148/100  Pulse: 98 98  Resp:    Temp:      Last Pain:  Vitals:   04/08/16 2000  TempSrc:   PainSc: 2    Pain Goal: Patients Stated Pain Goal: 2 (04/08/16 2000)               Garrett Mitchum

## 2016-04-08 NOTE — Progress Notes (Addendum)
Subjective: Postpartum Day 1: Vaginal delivery, no laceration Patient up ad lib, reports no syncope or dizziness. Feeding:  Plans pumping Contraceptive plan:  Undecided  Baby stable in NICU on NPAP.  Objective: Vital signs in last 24 hours: Temp:  [98 F (36.7 C)-98.6 F (37 C)] 98 F (36.7 C) (09/12 1659) Pulse Rate:  [77-103] 103 (09/13 0405) Resp:  [12-18] 18 (09/12 2309) BP: (117-189)/(72-122) 156/90 (09/13 0405) SpO2:  [100 %] 100 % (09/12 1759)   Output this shift 1450 cc I/O balance last 24 hours +565 cc  Vitals:   04/08/16 2309 04/08/16 2341 04/09/16 0056 04/09/16 0405  BP: (!) 140/102 (!) 159/108 (!) 147/98 (!) 156/90  Pulse: 94 94 77 (!) 103  Resp: 18     Temp:      TempSrc:      SpO2:      Weight:      Height:       Has not required BP treatment with IV meds in last 24 hours.  Physical Exam:  General: alert Lochia: appropriate Uterine Fundus: firm Perineum: Perineum intact DVT Evaluation: No evidence of DVT seen on physical exam. Negative Homan's sign. Calf/Ankle edema is present, 1+ edema, DTR 2+, no clonus  On magnesium 1 gm/hr--delivered at 3:44pm yesterday  CBC Latest Ref Rng & Units 04/08/2016 04/08/2016 04/08/2016  WBC 4.0 - 10.5 K/uL 21.8(H) 18.9(H) 15.1(H)  Hemoglobin 12.0 - 15.0 g/dL 11.2(L) 11.3(L) 11.4(L)  Hematocrit 36.0 - 46.0 % 32.5(L) 32.2(L) 32.6(L)  Platelets 150 - 400 K/uL 205 208 210   Serum creat 1.24 (previous 1.18)   Assessment/Plan: Status post vaginal delivery day 1 at 31 weeks Pre-eclampsia with severe features. Baby in NICU Elevated creatnine, but good urine output. Stable Continue current care. Continue magnesium x 24 hour pp. Continue Procardia 120 mg XL daily. MDs will follow.    Nyra CapesLATHAM, VICKICNM 04/09/2016, 6:08 AM   Addendum: Patient called me back to the room to examine her right antecubital space--appears to have had a rxn to adhesive. Rx hydrocortisone 1% cream to area TID.  Nigel BridgemanVicki Latham, CNM 04/09/16  6:30a  D/C Mg.  Recheck labs.  Cont Is and Os.  Will start HCTZ and cont procardia xl 120mg .  BPs are still elevated.  Pt is asymptomatic with 3+ edema in BLEs.

## 2016-04-08 NOTE — Progress Notes (Addendum)
  Subjective: Comfortable with epidural.  Denies HA, visual sx, or epigastric pain.  Objective: BP (!) 145/103   Pulse 95   Temp 98.6 F (37 C) (Oral)   Resp 17   Ht 5' 5.5" (1.664 m)   Wt 86.5 kg (190 lb 12.8 oz)   LMP 07/14/2015 (Exact Date)   SpO2 98%   BMI 31.27 kg/m  I/O last 3 completed shifts: In: 3229.5 [P.O.:1388; I.V.:1841.5] Out: 5805 [Urine:5805] Total I/O In: 975 [P.O.:720; I.V.:255] Out: 250 [Urine:250]  Vitals:   04/08/16 0031 04/08/16 0101 04/08/16 0131 04/08/16 0201  BP: (!) 143/94 (!) 134/93 (!) 145/100 (!) 145/103  Pulse: 90 86 95 95  Resp:  17    Temp:  98.6 F (37 C)    TempSrc:  Oral    SpO2:      Weight:      Height:        FHT: Category 1 UC:   Occasional, mild SVE:   Dilation: 1.5 Effacement (%): 80, 90 Station: Ballotable Exam by:: Charlotte Walton, CNM  Foley bulb deflated and removed without difficulty.  Vtx presentation, ballotable.  Small amount brownish bloody show noted, no active bleeding.  Assessment:  Induction at 31 weeks for pre-eclampsia with severe features. GBS negative  Plan: Start pitocin per low-dose protocol. Close observation of FHR and maternal status. PIH labs and mag level scheduled at 3am.  Charlotte Walton, Charlotte Walton CNM 04/08/2016, 2:38 AM

## 2016-04-08 NOTE — Progress Notes (Signed)
[facility]. Admission assessment completed. No concerns noted at this time. Carmelina DaneERRI L Raeqwon Lux, RN

## 2016-04-08 NOTE — Progress Notes (Signed)
  Subjective: Comfortable with epidural  Objective: BP 130/90   Pulse 78   Temp 98.1 F (36.7 C) (Oral)   Resp 18   Ht 5' 5.5" (1.664 m)   Wt 86.5 kg (190 lb 12.8 oz)   LMP 07/14/2015 (Exact Date)   SpO2 98%   BMI 31.27 kg/m  I/O last 3 completed shifts: In: 3229.5 [P.O.:1388; I.V.:1841.5] Out: 5805 [Urine:5805] Total I/O In: 1106.4 [P.O.:720; I.V.:386.4] Out: 650 [Urine:650] this shift  Magnesium running at 1 gm/hr. IV hydration at 30 cc/hr.    Vitals:   04/08/16 0401 04/08/16 0431 04/08/16 0501 04/08/16 0530  BP: (!) 131/91 133/84 (!) 134/94 130/90  Pulse: 77 81 79 78  Resp: 18  18   Temp:   98.1 F (36.7 C)   TempSrc:   Oral   SpO2:      Weight:      Height:        FHT: Category 1 in cycles, then periods of decreased variability, no decels. UC:   regular, every 6 minutes SVE:   Dilation: 1.5 Effacement (%): 80, 90 Station: Ballotable Exam by:: Manfred ArchV Orel Hord, CNM at 0222 Pitocin at 4 mu/min  CBC Latest Ref Rng & Units 04/08/2016 04/07/2016 04/07/2016  WBC 4.0 - 10.5 K/uL 15.1(H) 18.2(H) 17.5(H)  Hemoglobin 12.0 - 15.0 g/dL 11.4(L) 11.9(L) 12.4  Hematocrit 36.0 - 46.0 % 32.6(L) 33.6(L) 34.7(L)  Platelets 150 - 400 K/uL 210 203 194   CMP Latest Ref Rng & Units 04/08/2016 04/07/2016 04/07/2016  Glucose 65 - 99 mg/dL 87 99 98  BUN 6 - 20 mg/dL 20 18 16   Creatinine 0.44 - 1.00 mg/dL 0.45(W1.18(H) 0.98(J1.13(H) 1.91(Y1.10(H)  Sodium 135 - 145 mmol/L 132(L) 133(L) 130(L)  Potassium 3.5 - 5.1 mmol/L 4.2 4.3 4.0  Chloride 101 - 111 mmol/L 101 103 100(L)  CO2 22 - 32 mmol/L 24 22 21(L)  Calcium 8.9 - 10.3 mg/dL 7.5(L) 7.9(L) 7.9(L)  Total Protein 6.5 - 8.1 g/dL 6.1(L) 6.4(L) 6.4(L)  Total Bilirubin 0.3 - 1.2 mg/dL 0.6 0.7 0.4  Alkaline Phos 38 - 126 U/L 106 111 112  AST 15 - 41 U/L 40 51(H) 53(H)  ALT 14 - 54 U/L 42 47 52   Uric acid 11.1 (previous 9.5 on 04/07/16) LDH 204 (previous 217 on 04/07/16) Magnesium 6.8  Assessment:  Induction at 31 weeks, pre-eclampsia with severe  features. GBS negative  Plan: Continue current care, with close observation of maternal/fetal status. Dr. Su Hiltoberts updated.  Nigel BridgemanLATHAM, Aurther Harlin CNM 04/08/2016, 6:12 AM

## 2016-04-08 NOTE — Progress Notes (Signed)
Charlotte Walton is a 25 y.o. G1P0000 at 6131w0dadmitted for induction of labor due to pre-eclampsia with severe features. IOL ongoing since 04/05/16.  Subjective:  Feeling tired but denies headache, visual symptoms or epigastric pain Comfortable with  Epidural Pitocin at 9 mU/min    Objective: BP (!) 146/101 (BP Location: Right Arm)   Pulse 86   Temp 98.6 F (37 C) (Oral)   Resp 16   Ht 5' 5.5" (1.664 m)   Wt 190 lb 12.8 oz (86.5 kg)   LMP 07/14/2015 (Exact Date)   SpO2 98%   BMI 31.27 kg/m  I/O last 3 completed shifts: In: 3433.3 [P.O.:1733; I.V.:1700.3] Out: 3580 [Urine:3580] Total I/O In: 111.9 [I.V.:111.9] Out: 225 [Urine:225]  FHT: baseline 120-125 bpm, decreased variability. Episodes of late decelerations earlier resolved with change of position  Bedside ultrasound: vertex, LOA, no cord below presenting part. AROM with pudendal block needle and pressure at fundus with OR on standby: clear fluid. Vertex came down nicely. IUPC and scalp lead placed easily SVE:   Dilation: 2.5 Effacement (%): 100 Station: -1 Exam by:: MD Topanga Alvelo  Labs:  Creatinine increased to 1.18. LFT and platelets normal  Lab Results  Component Value Date   WBC 15.1 (H) 04/08/2016   HGB 11.4 (L) 04/08/2016   HCT 32.6 (L) 04/08/2016   MCV 87.2 04/08/2016   PLT 210 04/08/2016    Assessment / Plan: 31 weeks with pre-eclampsia with severe features  Day #4 of induction  Fetal Wellbeing: reassuring at this time Anticipated MOD:  NSVD  Reviewed with patient and family high risk of cesarean section ( which patient declines at this time). Cesarean section reviewed with pt with R&B including but not limited to:  bleeding, infection, injury to other organs. Low transverse approach planned which will allow vaginal delivery with future pregnancies. Should a vertical incision or inverted T be needed, patient is aware that repeat cesarean sections would be recommended in the future. Expected hospital stay  and recovery also discussed. Questions answered.   Dyna Figuereo A 04/08/2016, 9:48 AM

## 2016-04-09 LAB — CBC
HCT: 33 % — ABNORMAL LOW (ref 36.0–46.0)
HEMATOCRIT: 31.5 % — AB (ref 36.0–46.0)
HEMOGLOBIN: 10.9 g/dL — AB (ref 12.0–15.0)
Hemoglobin: 11.5 g/dL — ABNORMAL LOW (ref 12.0–15.0)
MCH: 30.5 pg (ref 26.0–34.0)
MCH: 30.6 pg (ref 26.0–34.0)
MCHC: 34.8 g/dL (ref 30.0–36.0)
MCHC: 34.6 g/dL (ref 30.0–36.0)
MCV: 87.5 fL (ref 78.0–100.0)
MCV: 88.5 fL (ref 78.0–100.0)
PLATELETS: 194 10*3/uL (ref 150–400)
Platelets: 201 10*3/uL (ref 150–400)
RBC: 3.77 MIL/uL — AB (ref 3.87–5.11)
RBC: 3.56 MIL/uL — AB (ref 3.87–5.11)
RDW: 13.5 % (ref 11.5–15.5)
RDW: 13.5 % (ref 11.5–15.5)
WBC: 17.6 10*3/uL — AB (ref 4.0–10.5)
WBC: 15.7 10*3/uL — AB (ref 4.0–10.5)

## 2016-04-09 LAB — COMPREHENSIVE METABOLIC PANEL
ALBUMIN: 1.9 g/dL — AB (ref 3.5–5.0)
ALBUMIN: 1.9 g/dL — AB (ref 3.5–5.0)
ALK PHOS: 136 U/L — AB (ref 38–126)
ALT: 34 U/L (ref 14–54)
ALT: 33 U/L (ref 14–54)
ANION GAP: 8 (ref 5–15)
AST: 38 U/L (ref 15–41)
AST: 35 U/L (ref 15–41)
Alkaline Phosphatase: 151 U/L — ABNORMAL HIGH (ref 38–126)
Anion gap: 7 (ref 5–15)
BILIRUBIN TOTAL: 0.5 mg/dL (ref 0.3–1.2)
BILIRUBIN TOTAL: 0.2 mg/dL — AB (ref 0.3–1.2)
BUN: 22 mg/dL — AB (ref 6–20)
BUN: 19 mg/dL (ref 6–20)
CALCIUM: 7.1 mg/dL — AB (ref 8.9–10.3)
CHLORIDE: 105 mmol/L (ref 101–111)
CO2: 24 mmol/L (ref 22–32)
CO2: 24 mmol/L (ref 22–32)
Calcium: 7.4 mg/dL — ABNORMAL LOW (ref 8.9–10.3)
Chloride: 105 mmol/L (ref 101–111)
Creatinine, Ser: 1.33 mg/dL — ABNORMAL HIGH (ref 0.44–1.00)
Creatinine, Ser: 1.11 mg/dL — ABNORMAL HIGH (ref 0.44–1.00)
GFR calc Af Amer: >60 mL/min (ref >60)
GFR calc Af Amer: >60 mL/min (ref >60)
GFR calc non Af Amer: 56 mL/min — ABNORMAL LOW (ref >60)
GFR calc non Af Amer: >60 mL/min (ref >60)
GLUCOSE: 96 mg/dL (ref 65–99)
GLUCOSE: 104 mg/dL — AB (ref 65–99)
POTASSIUM: 4.5 mmol/L (ref 3.5–5.1)
POTASSIUM: 4 mmol/L (ref 3.5–5.1)
SODIUM: 136 mmol/L (ref 135–145)
SODIUM: 137 mmol/L (ref 135–145)
TOTAL PROTEIN: 6 g/dL — AB (ref 6.5–8.1)
Total Protein: 5.9 g/dL — ABNORMAL LOW (ref 6.5–8.1)

## 2016-04-09 LAB — URIC ACID: Uric Acid, Serum: 9.9 mg/dL — ABNORMAL HIGH (ref 2.3–6.6)

## 2016-04-09 LAB — MAGNESIUM: MAGNESIUM: 6 mg/dL — AB (ref 1.7–2.4)

## 2016-04-09 MED ORDER — HYDROCORTISONE 1 % EX CREA
TOPICAL_CREAM | Freq: Three times a day (TID) | CUTANEOUS | Status: DC
  Administered 2016-04-09 – 2016-04-12 (×6): via TOPICAL
  Filled 2016-04-09: qty 28

## 2016-04-09 MED ORDER — HYDROCHLOROTHIAZIDE 25 MG PO TABS
25.0000 mg | ORAL_TABLET | Freq: Two times a day (BID) | ORAL | Status: DC
  Administered 2016-04-10 – 2016-04-12 (×5): 25 mg via ORAL
  Filled 2016-04-09 (×8): qty 1

## 2016-04-09 NOTE — Lactation Note (Signed)
This note was copied from a baby's chart. Lactation Consultation Note  Patient Name: Charlotte Walton [id number]'XToday's Date: 04/09/2016 Reason for consult: Initial assessment;NICU baby;Other (Comment) (31 weeks, Mom on mag, PIH)   Mom is now 23 hours post partum, just coming of magnesium drip, but with severe hypertension, going on HCTZ 2 x a day along with her other BP medication. Mom has been pumping but not expressing any colostrum. I decreased her to 21 flanges with a better fit, advised her to use coconut oil with pumping, and showed mom how to set pump for initiation,and follow with hand expression. Mom was thrilled to express a small drop of colostrum from her right breast. I reviewed the NICU booklet with mom, and told mom to keep asking when her baby is stable enough for her to hold and do skin to skin. Mom and dad very receptive to teaching. [facility] - mom had not applied as of yet. Mom knows to call for questions/concerns.    Maternal Data Formula Feeding for Exclusion: Yes (baby in NICU) Has patient been taught Hand Expression?: Yes Does the patient have breastfeeding experience prior to this delivery?: No  Feeding    LATCH Score/Interventions    Audible Swallowing:  (small drop from right brest only)  Type of Nipple: Everted at rest and after stimulation  Comfort (Breast/Nipple): Soft / non-tender           Lactation Tools Discussed/Used WIC Program: No (mom needs to apply and get DEP - [facility] and make an appointmetn) Pump Review: Setup, frequency, and cleaning;Milk Storage;Other (comment) (pump settings, hand expression and review of NICU booklet) Initiated by:: bedside RN  Date initiated:: 04/08/16 (soon after baby born, as per mom)   Consult Status Consult Status: Follow-up Date: 04/10/16 Follow-up type: In-patient    Charlotte Walton, Charlotte Swett Anne 04/09/2016, 4:38 PM

## 2016-04-10 LAB — COMPREHENSIVE METABOLIC PANEL
ALBUMIN: 1.9 g/dL — AB (ref 3.5–5.0)
ALT: 29 U/L (ref 14–54)
ANION GAP: 7 (ref 5–15)
AST: 34 U/L (ref 15–41)
Alkaline Phosphatase: 121 U/L (ref 38–126)
BILIRUBIN TOTAL: 0.6 mg/dL (ref 0.3–1.2)
BUN: 20 mg/dL (ref 6–20)
CO2: 24 mmol/L (ref 22–32)
Calcium: 7.1 mg/dL — ABNORMAL LOW (ref 8.9–10.3)
Chloride: 105 mmol/L (ref 101–111)
Creatinine, Ser: 1.15 mg/dL — ABNORMAL HIGH (ref 0.44–1.00)
GFR calc non Af Amer: >60 mL/min (ref >60)
GLUCOSE: 76 mg/dL (ref 65–99)
POTASSIUM: 4.1 mmol/L (ref 3.5–5.1)
SODIUM: 136 mmol/L (ref 135–145)
TOTAL PROTEIN: 5.6 g/dL — AB (ref 6.5–8.1)

## 2016-04-10 LAB — CBC
HCT: 29.4 % — ABNORMAL LOW (ref 36.0–46.0)
Hemoglobin: 10.1 g/dL — ABNORMAL LOW (ref 12.0–15.0)
MCH: 30.4 pg (ref 26.0–34.0)
MCHC: 34.4 g/dL (ref 30.0–36.0)
MCV: 88.6 fL (ref 78.0–100.0)
PLATELETS: 211 10*3/uL (ref 150–400)
RBC: 3.32 MIL/uL — ABNORMAL LOW (ref 3.87–5.11)
RDW: 13.6 % (ref 11.5–15.5)
WBC: 15.9 10*3/uL — AB (ref 4.0–10.5)

## 2016-04-10 LAB — LACTATE DEHYDROGENASE: LDH: 241 U/L — ABNORMAL HIGH (ref 98–192)

## 2016-04-10 LAB — URIC ACID: Uric Acid, Serum: 9.2 mg/dL — ABNORMAL HIGH (ref 2.3–6.6)

## 2016-04-10 NOTE — Lactation Note (Signed)
This note was copied from a baby's chart. Lactation Consultation Note  Patient Name: Charlotte Walton ULGSP'J Date: 04/10/2016 Reason for consult: Follow-up assessment;NICU baby NICU baby 102 hours old. Mom reports that she has been pumping every 3 hours and taking EBM to NICU for the baby. Mom states that she is getting more colostrum when she hand expresses. Discussed progression of milk coming to volume. Also discussed taking the pumping kit when mom is D/C'd, and mom aware of pumping rooms in the NICU. Enc mom to call [place] in the morning (04-11-16) to see about getting a DEBP.   Maternal Data    Feeding Feeding Type: Donor Breast Milk  LATCH Score/Interventions                      Lactation Tools Discussed/Used     Consult Status Consult Status: Follow-up Date: 04/11/16 Follow-up type: In-patient    Andres Labrum 04/10/2016, 7:31 PM

## 2016-04-10 NOTE — Progress Notes (Signed)
Subjective: Postpartum Day 2: Vaginal delivery, no laceration. Patient up ad lib, reports no syncope or dizziness. Feeding: Plans pumping. Contraceptive plan: Undecided.  Baby stable in NICU on NPAP.  Objective: Vital signs in last 24 hours: Temp:  [97.7 F (36.5 C)-98.8 F (37.1 C)] 98.6 F (37 C) (09/14 0400) Pulse Rate:  [88-109] 96 (09/14 0400) Resp:  [18] 18 (09/14 0613) BP: (125-155)/(81-108) 130/95 (09/14 0400) Weight:  [90.4 kg (199 lb 4.8 oz)] 90.4 kg (199 lb 4.8 oz) (09/13 1106)   Output this shift = 2300 ml I/O balance last 24 hours =  -4104  Physical Exam:  General: alert Lochia: appropriate Uterine Fundus: firm Perineum: intact DVT Evaluation: No evidence of DVT seen on physical exam. Negative Homan's sign. 3+ edema in BLEs, 2+ DTRS bilaterally, no clonus.   CBC Latest Ref Rng & Units 04/10/2016 04/09/2016 04/09/2016  WBC 4.0 - 10.5 K/uL 15.9(H) 15.7(H) 17.6(H)  Hemoglobin 12.0 - 15.0 g/dL 10.1(L) 10.9(L) 11.5(L)  Hematocrit 36.0 - 46.0 % 29.4(L) 31.5(L) 33.0(L)  Platelets 150 - 400 K/uL 211 201 194   preE labs pending for this morning.  Assessment/Plan: Status post vaginal delivery day 2 at 31 wks. Pre-eclampsia with severe features. Baby in NICU. Elevated creatnine, but good urine output. Continue current care. Continue Procardia 120 mg XL daily and HCTZ 25 mg bid.   Robyne AskewWILLIAMS, KIMBERLYCNM 04/10/2016, 6:51 AM

## 2016-04-10 NOTE — Progress Notes (Signed)
CLINICAL SOCIAL WORK MATERNAL/CHILD NOTE  Patient Details  Name: Charlotte Walton MRN: [id number] Date of Birth: 04/08/2014  Date:  04/10/2016  Clinical Social Worker Initiating Note:  Raye SorrowHannah N Yida Hyams, LCSW  Date/ Time Initiated:  04/10/16/1207              Child's Name:  Charlotte Walton   Legal Guardian:  Mother   Need for Interpreter:  None   Date of Referral:  04/10/16     Reason for Referral:  Other (Comment) (NICU admission)   Referral Source:  RN   Address:     Phone number:      Household Members: Significant Other   Natural Supports (not living in the home): Extended Family, Friends, Immediate Family, Parent   Professional Supports:None   Employment:Other (comment) (MOB has been on bedrest since July)   Type of Work: did not get to assess (MOB in shower, FOB in room)   Education:  High school Therapist, sportsgraduate   Financial Resources:Medicaid   Other Resources: [employer], Sales executiveood Stamps    Cultural/Religious Considerations Which May Impact Care: none  Strengths: Ability to meet basic needs , Compliance with medical plan , Home prepared for child    Risk Factors/Current Problems: None   Cognitive State: Alert    Mood/Affect: Calm , Comfortable  (MOB in shower, asssessment completed with FOB)   CSW Assessment:LCSW following due to NICU admission.  Baby is a 31 weeker per notes and LCSW following for emotional support, questions, concerns, and understanding of SW involved in care while baby is in NICU.  MOB was in shower at time of visit, however FOB in room, agreed to speak and LCSW introduced self and services. Explained needs while in NICU and if there were any concerns or questions currently. FOB denied at this time. He reports he and MOB are doing well and adjusting to baby in NICU.  FOB was questioned about address listed on face sheet to which he reports that is where MOB grew up, [place].   FOB reports good family support for both parents and no needs at this time.  MOB was active in prenatal and there are no concerns noted at this time. LCSW spoke with RN on antenatal who has taken care of patient off an on during her long stay on antenatal and there are no concerns. LCSW will follow back up with MOB, introduce self and assist with any needs as they arise. Available for services, but no concerns or referrals completed at this time.  CSW Plan/Description: Restaurant manager, fast foodatient/Family Education , Psychosocial Support and Ongoing Assessment of Needs    Cordella RegisterCoble, Chamaine Stankus N, LCSW 04/10/2016, 12:09 PM

## 2016-04-11 LAB — CREATININE, SERUM
CREATININE: 0.77 mg/dL (ref 0.44–1.00)
GFR calc Af Amer: >60 mL/min (ref >60)
GFR calc non Af Amer: >60 mL/min (ref >60)

## 2016-04-11 MED ORDER — LABETALOL HCL 200 MG PO TABS
200.0000 mg | ORAL_TABLET | Freq: Three times a day (TID) | ORAL | Status: DC
  Administered 2016-04-11 – 2016-04-12 (×3): 200 mg via ORAL
  Filled 2016-04-11 (×3): qty 1

## 2016-04-11 NOTE — Lactation Note (Signed)
This note was copied from a baby's chart. Lactation Consultation Note  Patient Name: [name] UJWJX'BToday's Date: 04/11/2016 Reason for consult: Follow-up assessment   With this mom of a NICU baby, now 5068 hours old, and 31 3/7 weeks CGA. Mom still has elevated BP's, but better, and may go home today. I re - faxed [facility] - [facility]. Mom has paper work for a [employer] loaner DEP, since [facility], even if she is discharged today.  Mom's breasts are very full and somewhat engorged. I had mom pump in maintenance setting now, and she expressed about 30 ml's of milk. I gave mom ice and reviewed engorgement care. [facility] if going home today, but she does have the paper work.   Maternal Data    Feeding Feeding Type: Breast Milk Length of feed: 20 min  LATCH Score/Interventions                      Lactation Tools Discussed/Used WIC Program: Yes ([place] - Fax resent today for DEP)   Consult Status Consult Status: Follow-up Date: 04/12/16 Follow-up type: In-patient    Alfred LevinsLee, Mary-Ann Pennella Anne 04/11/2016, 2:45 PM

## 2016-04-11 NOTE — Progress Notes (Signed)
Subjective: Postpartum Day 3: Vaginal delivery, no laceration Patient up ad lib, reports no syncope or dizziness.  Had occasional "floaters" in eyesight yesterday, none this am.  Denies HA, epigastric pain. Feeding:  Pumping Contraceptive plan:  Undecided  Baby remains stable in NICU--on NPAP, beginning feedings  Objective: Vital signs in last 24 hours: Temp:  [98.2 F (36.8 C)-98.5 F (36.9 C)] 98.4 F (36.9 C) (09/15 0411) Pulse Rate:  [78-103] 82 (09/15 0411) Resp:  [16-18] 16 (09/15 0411) BP: (129-157)/(88-107) 145/98 (09/15 0411) SpO2:  [100 %] 100 % (09/14 2241)   Vitals:   04/10/16 2033 04/10/16 2241 04/11/16 0118 04/11/16 0411  BP: (!) 145/96 (!) 155/99 (!) 129/103 (!) 145/98  Pulse: (!) 103 90 (!) 102 82  Resp: 16 16 16 16   Temp: 98.2 F (36.8 C)  98.3 F (36.8 C) 98.4 F (36.9 C)  TempSrc: Oral  Oral Oral  SpO2: 100% 100%    Weight:      Height:        Urine output:  1000 cc since 12 MN  Has not required IV antihypertensives since 04/08/16.  Marland Kitchen. enoxaparin (LOVENOX) injection  40 mg Subcutaneous Q24H  . ferrous sulfate  325 mg Oral BID WC  . hydrochlorothiazide  25 mg Oral BID  . hydrocortisone cream   Topical TID  . ibuprofen  600 mg Oral Q6H  . measles, mumps and rubella vaccine  0.5 mL Subcutaneous Once  . NIFEdipine  120 mg Oral Daily  . prenatal multivitamin  1 tablet Oral Q1200  . senna-docusate  2 tablet Oral Q24H  . Tdap  0.5 mL Intramuscular Once    Physical Exam:  General: drowsy, but oriented Lochia: appropriate Uterine Fundus: firm Perineum: Intact DVT Evaluation: No evidence of DVT seen on physical exam. Negative Homan's sign. Calf/Ankle edema is present, 2+, DTR 2+.   CBC Latest Ref Rng & Units 04/10/2016 04/09/2016 04/09/2016  WBC 4.0 - 10.5 K/uL 15.9(H) 15.7(H) 17.6(H)  Hemoglobin 12.0 - 15.0 g/dL 10.1(L) 10.9(L) 11.5(L)  Hematocrit 36.0 - 46.0 % 29.4(L) 31.5(L) 33.0(L)  Platelets 150 - 400 K/uL 211 201 194   CMP Latest Ref Rng &  Units 04/10/2016 04/09/2016 04/09/2016  Glucose 65 - 99 mg/dL 76 960(A104(H) 96  BUN 6 - 20 mg/dL 20 19 54(U22(H)  Creatinine 0.44 - 1.00 mg/dL 9.81(X1.15(H) 9.14(N1.11(H) 8.29(F1.33(H)  Sodium 135 - 145 mmol/L 136 137 136  Potassium 3.5 - 5.1 mmol/L 4.1 4.0 4.5  Chloride 101 - 111 mmol/L 105 105 105  CO2 22 - 32 mmol/L 24 24 24   Calcium 8.9 - 10.3 mg/dL 7.1(L) 7.1(L) 7.4(L)  Total Protein 6.5 - 8.1 g/dL 6.2(Z5.6(L) 5.9(L) 6.0(L)  Total Bilirubin 0.3 - 1.2 mg/dL 0.6 3.0(Q0.2(L) 0.5  Alkaline Phos 38 - 126 U/L 121 136(H) 151(H)  AST 15 - 41 U/L 34 35 38  ALT 14 - 54 U/L 29 33 34   Uric acid 9.2 on 9/15 LDH 241 on 9/15  On Lovenox daily  Assessment/Plan: Status post vaginal delivery day 2 at 30 5/7 weeks--pre-eclampsia with severe features, still hypertensive, on HCTZ and Procardia 120 mg XL. Elevated serum creatnine, but stable  Continue current care. Serum creatinine pending this am. Will defer decision regarding d/c to Dr. Normand Sloopillard.   Nigel BridgemanLATHAM, Viveka Wilmeth CNM 04/11/2016, 5:48 AM

## 2016-04-12 MED ORDER — LABETALOL HCL 200 MG PO TABS: 200.0000 mg | ORAL_TABLET | Freq: Three times a day (TID) | ORAL | 2 refills | Status: DC

## 2016-04-12 MED ORDER — NIFEDIPINE ER 60 MG PO TB24: 120.0000 mg | ORAL_TABLET | Freq: Every day | ORAL | 2 refills | Status: DC

## 2016-04-12 MED ORDER — HYDROCHLOROTHIAZIDE 25 MG PO TABS: 25.0000 mg | ORAL_TABLET | Freq: Two times a day (BID) | ORAL | 0 refills | Status: DC

## 2016-04-12 MED ORDER — IBUPROFEN 600 MG PO TABS: 600.0000 mg | ORAL_TABLET | Freq: Four times a day (QID) | ORAL | 2 refills | Status: DC | PRN

## 2016-04-12 NOTE — Discharge Summary (Signed)
[facility] Discharge Summary   Patient Name:   Charlotte Walton DOB:     [birth year] MRN:     [id number]  Date of Admission:   03/22/2016 Date of Discharge:  04/12/2016  Admitting diagnosis:    28 weeks high blood pressure Principal Problem:   Vaginal delivery Active Problems:   Pre-eclampsia, severe, antepartum   History of irregular menstrual cycles   Preterm labor   Pre-eclampsia, severe, delivered with postpartum condition      Discharge diagnosis:    28 weeks high blood pressure Principal Problem:   Vaginal delivery Active Problems:   Pre-eclampsia, severe, antepartum   History of irregular menstrual cycles   Preterm labor   Pre-eclampsia, severe, delivered with postpartum condition                                                                 Post partum procedures: Magnesium sulfate x 24 hours PP  Type of Delivery:  SVB  Delivering Provider: Silverio Lay   Date of Delivery:  04/08/16  Newborn Data:    Live born female  Birth Weight: 3 lb 1.4 oz (1400 g) APGAR: 3, 5  Baby's Name:   Zara Baby Feeding:   Breast Disposition:   NICU  Complications:   Elevated BP--required 3 medications for control  Hospital course:      Induction of Labor With Vaginal Delivery   25 y.o. yo G1P0101 at [redacted]w[redacted]d was admitted to the hospital 03/22/2016 for induction of labor.  Indication for induction: Preeclampsia.  Patient had an uncomplicated labor course as follows: Membrane Rupture Time/Date: 9:18 AM ,04/08/2016   Intrapartum Procedures: Episiotomy: None [1]                                         Lacerations:  None [1]  Patient had delivery of a Viable infant.  Information for the patient's newborn:  Nicolle, Heward [[id number]]  Delivery Method: Vaginal, Spontaneous Delivery (Filed from Delivery Summary)   04/08/2016  Details of delivery can be found in separate delivery note.  Patient had a routine postpartum course. Patient is discharged home  04/12/16.  Patient admitted on 8/27 with pre-eclampsia with severe features.  She had been previously admitted 8/25--8/26 for new dx of pre-eclampsia, without severe features at that time.  She received betamethasone during that hospitalization.   Induction was begun on 04/05/16, with utilization of cytotech, foley balloon, pitocin, and AROM, with vaginal delivery occurring on 9/12.  She had been on magnesium during labor, and also on Procardia 120 mg XL.  Baby went to NICU, but was only on NPAP, and was doing well.  Patient's BP remained elevated, with eventual regimen of HCTZ 25 mg po BID, Procardia 120 mg XL daily, Labetalol 200 mg po BID.  She was d/c'd home on 04/12/16, with d/c instructions regarding monitoring for worsening s/x and any other complications.  She is to f/u in the office on Monday, 9/18, for BP check.   Physical Exam:  Vitals:   04/11/16 1722 04/11/16 2125 04/12/16 0723 04/12/16 0949  BP: (!) 144/100 (!) 127/97 (!) 148/98 138/88  Pulse: 92 (!) 109 94 75  Resp: 18 20  16   Temp: 99.4 F (37.4 C) 99.4 F (37.4 C)  98.3 F (36.8 C)  TempSrc: Oral Oral  Oral  SpO2: 99% 95%  98%  Weight:      Height:       General: alert and cooperative Lochia: appropriate Uterine Fundus: firm Incision: Perineum clear DVT Evaluation: No evidence of DVT seen on physical exam. Negative Homan's sign.  Labs:  CBC Latest Ref Rng & Units 04/10/2016 04/09/2016 04/09/2016  WBC 4.0 - 10.5 K/uL 15.9(H) 15.7(H) 17.6(H)  Hemoglobin 12.0 - 15.0 g/dL 10.1(L) 10.9(L) 11.5(L)  Hematocrit 36.0 - 46.0 % 29.4(L) 31.5(L) 33.0(L)  Platelets 150 - 400 K/uL 211 201 194     Discharge instruction: per After Visit Summary and "Baby and Me Booklet".  After Visit Meds:    Medication List    TAKE these medications   acetaminophen 325 MG tablet Commonly known as:  TYLENOL Take 650 mg by mouth every 6 (six) hours as needed for mild pain, moderate pain or headache.   CONCEPT DHA 53.5-38-1 MG Caps Take 1  capsule by mouth daily.   hydrochlorothiazide 25 MG tablet Commonly known as:  HYDRODIURIL Take 1 tablet (25 mg total) by mouth 2 (two) times daily.   ibuprofen 600 MG tablet Commonly known as:  ADVIL,MOTRIN Take 1 tablet (600 mg total) by mouth every 6 (six) hours as needed.   labetalol 200 MG tablet Commonly known as:  NORMODYNE Take 1 tablet (200 mg total) by mouth 3 (three) times daily.   NIFEdipine 60 MG 24 hr tablet Commonly known as:  PROCARDIA-XL/ADALAT CC Take 2 tablets (120 mg total) by mouth daily. Start taking on:  04/13/2016       Diet: routine diet  Activity: Advance as tolerated. Pelvic rest for 6 weeks.   Outpatient follow up:2 days for BP check. Follow up Appt:No future appointments. Follow up visit: No Follow-up on file.  Postpartum contraception: will decide later  04/12/2016 Nigel BridgemanLATHAM, Anoushka Divito, CNM

## 2016-04-12 NOTE — Discharge Instructions (Signed)
Postpartum Care After Vaginal Delivery °After you deliver your newborn (postpartum period), the usual stay in the hospital is 24-72 hours. If there were problems with your labor or delivery, or if you have other medical problems, you might be in the hospital longer.  °While you are in the hospital, you will receive help and instructions on how to care for yourself and your newborn during the postpartum period.  °While you are in the hospital: °· Be sure to tell your nurses if you have pain or discomfort, as well as where you feel the pain and what makes the pain worse. °· If you had an incision made near your vagina (episiotomy) or if you had some tearing during delivery, the nurses may put ice packs on your episiotomy or tear. The ice packs may help to reduce the pain and swelling. °· If you are breastfeeding, you may feel uncomfortable contractions of your uterus for a couple of weeks. This is normal. The contractions help your uterus get back to normal size. °· It is normal to have some bleeding after delivery. °¨ For the first 1-3 days after delivery, the flow is red and the amount may be similar to a period. °¨ It is common for the flow to start and stop. °¨ In the first few days, you may pass some small clots. Let your nurses know if you begin to pass large clots or your flow increases. °¨ Do not  flush blood clots down the toilet before having the nurse look at them. °¨ During the next 3-10 days after delivery, your flow should become more watery and pink or brown-tinged in color. °¨ Ten to fourteen days after delivery, your flow should be a small amount of yellowish-white discharge. °¨ The amount of your flow will decrease over the first few weeks after delivery. Your flow may stop in 6-8 weeks. Most women have had their flow stop by 12 weeks after delivery. °· You should change your sanitary pads frequently. °· Wash your hands thoroughly with soap and water for at least 20 seconds after changing pads, using  the toilet, or before holding or feeding your newborn. °· You should feel like you need to empty your bladder within the first 6-8 hours after delivery. °· In case you become weak, lightheaded, or faint, call your nurse before you get out of bed for the first time and before you take a shower for the first time. °· Within the first few days after delivery, your breasts may begin to feel tender and full. This is called engorgement. Breast tenderness usually goes away within 48-72 hours after engorgement occurs. You may also notice milk leaking from your breasts. If you are not breastfeeding, do not stimulate your breasts. Breast stimulation can make your breasts produce more milk. °· Spending as much time as possible with your newborn is very important. During this time, you and your newborn can feel close and get to know each other. Having your newborn stay in your room (rooming in) will help to strengthen the bond with your newborn.  It will give you time to get to know your newborn and become comfortable caring for your newborn. °· Your hormones change after delivery. Sometimes the hormone changes can temporarily cause you to feel sad or tearful. These feelings should not last more than a few days. If these feelings last longer than that, you should talk to your caregiver. °· If desired, talk to your caregiver about methods of family planning or contraception. °·   Talk to your caregiver about immunizations. Your caregiver may want you to have the following immunizations before leaving the hospital: °¨ Tetanus, diphtheria, and pertussis (Tdap) or tetanus and diphtheria (Td) immunization. It is very important that you and your family (including grandparents) or others caring for your newborn are up-to-date with the Tdap or Td immunizations. The Tdap or Td immunization can help protect your newborn from getting ill. °¨ Rubella immunization. °¨ Varicella (chickenpox) immunization. °¨ Influenza immunization. You should  receive this annual immunization if you did not receive the immunization during your pregnancy. °  °This information is not intended to replace advice given to you by your health care provider. Make sure you discuss any questions you have with your health care provider. °  °Document Released: 05/11/2007 Document Revised: 04/07/2012 Document Reviewed: 03/10/2012 °Elsevier Interactive Patient Education ©2016 [employer] ° °Preeclampsia and Eclampsia °Preeclampsia is a serious condition that develops only during pregnancy. It is also called toxemia of pregnancy. This condition causes high blood pressure along with other symptoms, such as swelling and headaches. These may develop as the condition gets worse. Preeclampsia may occur 20 weeks or later into your pregnancy.  °Diagnosing and treating preeclampsia early is very important. If not treated early, it can cause serious problems for you and your baby. One problem it can lead to is eclampsia, which is a condition that causes muscle jerking or shaking (convulsions) in the mother. Delivering your baby is the best treatment for preeclampsia or eclampsia.  °RISK FACTORS °The cause of preeclampsia is not known. You may be more likely to develop preeclampsia if you have certain risk factors. These include:  °· Being pregnant for the first time. °· Having preeclampsia in a past pregnancy. °· Having a family history of preeclampsia. °· Having high blood pressure. °· Being pregnant with twins or triplets. °· Being 35 or older. °· Being African American. °· Having kidney disease or diabetes. °· Having medical conditions such as lupus or blood diseases. °· Being very overweight (obese). °SIGNS AND SYMPTOMS  °The earliest signs of preeclampsia are: °· High blood pressure. °· Increased protein in your urine. Your health care provider will check for this at every prenatal visit. °Other symptoms that can develop include:  °· Severe headaches. °· Sudden weight gain. °· Swelling of  your hands, face, legs, and feet. °· Feeling sick to your stomach (nauseous) and throwing up (vomiting). °· Vision problems (blurred or double vision). °· Numbness in your face, arms, legs, and feet. °· Dizziness. °· Slurred speech. °· Sensitivity to bright lights. °· Abdominal pain. °DIAGNOSIS  °There are no screening tests for preeclampsia. Your health care provider will ask you about symptoms and check for signs of preeclampsia during your prenatal visits. You may also have tests, including: °· Urine testing. °· Blood testing. °· Checking your baby's heart rate. °· Checking the health of your baby and your placenta using images created with sound waves (ultrasound). °TREATMENT  °You can work out the best treatment approach together with your health care provider. It is very important to keep all prenatal appointments. If you have an increased risk of preeclampsia, you may need more frequent prenatal exams. °· Your health care provider may prescribe bed rest. °· You may have to eat as little salt as possible. °· You may need to take medicine to lower your blood pressure if the condition does not respond to more conservative measures. °· You may need to stay in the hospital if your condition is   severe. There, treatment will focus on controlling your blood pressure and fluid retention. You may also need to take medicine to prevent seizures.  If the condition gets worse, your baby may need to be delivered early to protect you and the baby. You may have your labor started with medicine (be induced), or you may have a cesarean delivery.  Preeclampsia usually goes away after the baby is born. HOME CARE INSTRUCTIONS   Only take over-the-counter or prescription medicines as directed by your health care provider.  Lie on your left side while resting. This keeps pressure off your baby.  Elevate your feet while resting.  Get regular exercise. Ask your health care provider what type of exercise is safe for  you.  Avoid caffeine and alcohol.  Do not smoke.  Drink 6-8 glasses of water every day.  Eat a balanced diet that is low in salt. Do not add salt to your food.  Avoid stressful situations as much as possible.  Get plenty of rest and sleep.  Keep all prenatal appointments and tests as scheduled. SEEK MEDICAL CARE IF:  You are gaining more weight than expected.  You have any headaches, abdominal pain, or nausea.  You are bruising more than usual.  You feel dizzy or light-headed. SEEK IMMEDIATE MEDICAL CARE IF:   You develop sudden or severe swelling anywhere in your body. This usually happens in the legs.  You gain 5 lb (2.3 kg) or more in a week.  You have a severe headache, dizziness, problems with your vision, or confusion.  You have severe abdominal pain.  You have lasting nausea or vomiting.  You have a seizure.  You have trouble moving any part of your body.  You develop numbness in your body.  You have trouble speaking.  You have any abnormal bleeding.  You develop a stiff neck.  You pass out. MAKE SURE YOU:   Understand these instructions.  Will watch your condition.  Will get help right away if you are not doing well or get worse.   This information is not intended to replace advice given to you by your health care provider. Make sure you discuss any questions you have with your health care provider.   Document Released: 07/11/2000 Document Revised: 07/19/2013 Document Reviewed: 05/06/2013 Elsevier Interactive Patient Education [employer]

## 2016-04-12 NOTE — Progress Notes (Signed)
Pt d/c'd with discharge instructions. Pt verbalized and understanding. No concerns noted at this time. Carmelina DaneERRI L Ramonda Galyon, RN

## 2016-04-12 NOTE — Lactation Note (Signed)
This note was copied from a baby's chart. Lactation Consultation Note  Patient Name: Charlotte Walton [id number]'s Date: 04/12/2016 Reason for consult: Follow-up assessment;NICU baby Mom is pumping and received 30 ml this pumping. [employer] loaner pump completed. Advised to continue to pump every 3 hours for 15-30 minutes. Discussed breast milk storage/labeling with Mom. Engorgement discussed. Mom to call for questions/concerns.   Maternal Data    Feeding Feeding Type: Breast Milk Length of feed: 30 min  LATCH Score/Interventions                      Lactation Tools Discussed/Used Tools: Pump Breast pump type: Double-Electric Breast Pump   Consult Status Consult Status: Complete Date: 04/12/16 Follow-up type: In-patient    Alfred LevinsGranger, Charlotte Walton 04/12/2016, 12:44 PM

## 2016-04-13 LAB — RPR: RPR Ser Ql: NONREACTIVE

## 2016-04-16 ENCOUNTER — Encounter (HOSPITAL_COMMUNITY): Payer: Self-pay

## 2016-04-16 ENCOUNTER — Inpatient Hospital Stay (HOSPITAL_COMMUNITY)
Admission: AD | Admit: 2016-04-16 | Discharge: 2016-04-16 | Disposition: A | Discharge status: Home / Self Care | Payer: Managed Care, Other (non HMO) | Source: Ambulatory Visit | Attending: Obstetrics and Gynecology | Admitting: Obstetrics and Gynecology

## 2016-04-16 DIAGNOSIS — R0602 Shortness of breath: Secondary | ICD-10-CM | POA: Diagnosis not present

## 2016-04-16 DIAGNOSIS — O9089 Other complications of the puerperium, not elsewhere classified: Secondary | ICD-10-CM | POA: Insufficient documentation

## 2016-04-16 DIAGNOSIS — R079 Chest pain, unspecified: Secondary | ICD-10-CM | POA: Diagnosis present

## 2016-04-16 DIAGNOSIS — R52 Pain, unspecified: Secondary | ICD-10-CM

## 2016-04-16 LAB — URINALYSIS, ROUTINE W REFLEX MICROSCOPIC
Glucose, UA: NEGATIVE mg/dL
KETONES UR: NEGATIVE mg/dL
LEUKOCYTES UA: NEGATIVE
NITRITE: NEGATIVE
PH: 6 (ref 5.0–8.0)
Protein, ur: >300 mg/dL — AB

## 2016-04-16 LAB — URINE MICROSCOPIC-ADD ON

## 2016-04-16 LAB — COMPREHENSIVE METABOLIC PANEL
ALK PHOS: 99 U/L (ref 38–126)
ALT: 25 U/L (ref 14–54)
AST: 22 U/L (ref 15–41)
Albumin: 2.6 g/dL — ABNORMAL LOW (ref 3.5–5.0)
Anion gap: 9 (ref 5–15)
BUN: 22 mg/dL — ABNORMAL HIGH (ref 6–20)
CALCIUM: 8.4 mg/dL — AB (ref 8.9–10.3)
CO2: 21 mmol/L — AB (ref 22–32)
CREATININE: 1.16 mg/dL — AB (ref 0.44–1.00)
Chloride: 104 mmol/L (ref 101–111)
GFR calc non Af Amer: >60 mL/min (ref >60)
Glucose, Bld: 130 mg/dL — ABNORMAL HIGH (ref 65–99)
Potassium: 4 mmol/L (ref 3.5–5.1)
SODIUM: 134 mmol/L — AB (ref 135–145)
Total Bilirubin: 0.5 mg/dL (ref 0.3–1.2)
Total Protein: 7.1 g/dL (ref 6.5–8.1)

## 2016-04-16 LAB — CBC
HCT: 34.6 % — ABNORMAL LOW (ref 36.0–46.0)
HEMOGLOBIN: 12.2 g/dL (ref 12.0–15.0)
MCH: 31.4 pg (ref 26.0–34.0)
MCHC: 35.3 g/dL (ref 30.0–36.0)
MCV: 89.2 fL (ref 78.0–100.0)
Platelets: 482 10*3/uL — ABNORMAL HIGH (ref 150–400)
RBC: 3.88 MIL/uL (ref 3.87–5.11)
RDW: 13.2 % (ref 11.5–15.5)
WBC: 17.1 10*3/uL — ABNORMAL HIGH (ref 4.0–10.5)

## 2016-04-16 MED ORDER — GI COCKTAIL ~~LOC~~
30.0000 mL | Freq: Once | ORAL | Status: AC
Start: 2016-04-16 — End: 2016-04-16
  Administered 2016-04-16: 30 mL via ORAL
  Filled 2016-04-16: qty 30

## 2016-04-16 NOTE — MAU Note (Signed)
Pt c/o chest pain that feels like someone is sitting on her chest since last week that is getting progressively worse. Pt c/o feeling shortness of breath even when sitting down.

## 2016-04-16 NOTE — MAU Provider Note (Signed)
History     CSN: [id number]  Arrival date and time: 04/16/16 1202   First Provider Initiated Contact with Patient 04/16/16 1227      Chief Complaint  Patient presents with  . Chest Pain  . Shortness of Breath   HPI 25 yo [id number] PPD# 8 s/p SVD at 31 weeks secondary to severe preeclampsia presenting today for the evaluation of worsening chest pain and shortness of breath. Patient states she had experienced these symptoms during her pregnancy but they seem to worsened since her delivery. Patient describes her chest pain as a Charlotte Walton pressure located in the epigastric region. She states that the pain is present regardless of her activity level and subsides temporarily and spontaneously for a while before returning again. The same applies with her shortness of breath. She states that initially she felt short of breath with walking but lately she feels that she is lacking air even when sedentary. She is without any other complaints. Her lochia is decreasing. She reports passing a large clot a week ago but nothing recently. She is taking her 3 antihypertensives as directed. She denies any headaches, lightheadedness/dizziness. Her daughter is still in the NICU and she is expressing milk without difficulty.    Past Medical History:  Diagnosis Date  . Medical history non-contributory   . Pregnancy induced hypertension     Past Surgical History:  Procedure Laterality Date  . NO PAST SURGERIES      Family History  Problem Relation Age of Onset  . Hyperlipidemia Mother   . Miscarriages / Stillbirths Sister   . Depression Sister   . Early death Maternal Grandmother   . Drug abuse Maternal Grandmother   . Hypertension Maternal Grandmother   . Stroke Maternal Grandmother     Social History  Substance Use Topics  . Smoking status: Never Smoker  . Smokeless tobacco: Never Used  . Alcohol use No     Comment: occ    Allergies: No Known Allergies  Prescriptions Prior to Admission   Medication Sig Dispense Refill Last Dose  . hydrochlorothiazide (HYDRODIURIL) 25 MG tablet Take 1 tablet (25 mg total) by mouth 2 (two) times daily. 14 tablet 0 04/16/2016 at Unknown time  . HYDROCORTISONE EX Apply 1 application topically 2 (two) times daily as needed (itching).   Past Week at Unknown time  . ibuprofen (ADVIL,MOTRIN) 600 MG tablet Take 1 tablet (600 mg total) by mouth every 6 (six) hours as needed. (Patient taking differently: Take 600 mg by mouth every 6 (six) hours as needed for moderate pain. ) 30 tablet 2 04/16/2016 at Unknown time  . labetalol (NORMODYNE) 200 MG tablet Take 1 tablet (200 mg total) by mouth 3 (three) times daily. 90 tablet 2 04/16/2016 at 1155  . NIFEdipine (PROCARDIA-XL/ADALAT CC) 60 MG 24 hr tablet Take 2 tablets (120 mg total) by mouth daily. 60 tablet 2 04/16/2016 at Unknown time  . Prenat-FeFum-FePo-FA-Omega 3 (CONCEPT DHA) 53.5-38-1 MG CAPS Take 1 capsule by mouth daily.   04/16/2016 at Unknown time  . acetaminophen (TYLENOL) 325 MG tablet Take 650 mg by mouth every 6 (six) hours as needed for mild pain, moderate pain or headache.    Not Taking at Unknown time    ROS Physical Exam   Blood pressure 111/75, pulse 95, temperature 98.5 F (36.9 C), temperature source Oral, resp. rate 18, height 5' 5.5" (1.664 m), weight 158 lb (71.7 kg), last menstrual period 07/14/2015, SpO2 98 %, unknown if currently breastfeeding.  Physical Exam  GENERAL: Well-developed, well-nourished female in no acute distress. Patient is talking at a normal pace effortlessly. She appears very comfortable as she is cleaning her breast pump supplies HEENT: Normocephalic, atraumatic. Sclerae anicteric.  NECK: Supple. Normal thyroid.  LUNGS: Clear to auscultation bilaterally.  HEART: Regular rate and rhythm. BREASTS: Symmetric in size. No palpable masses or lymphadenopathy, skin changes ABDOMEN: Soft, nontender, nondistended. No organomegaly. PELVIC: Normal external female genitalia.  Normal lochia EXTREMITIES: No cyanosis, clubbing, or edema, 2+ distal pulses.  MAU Course  Procedures  MDM EKG - Normal sinus rhythm with rate of 94 bpm GI cocktail given with some improvement in her symptoms Patient eager to be discharge to visit her daughter in NICU   Assessment and Plan  25 yo 8 days s/p SVD at 31 weeks secondary to preeclampsia - Report given to Dr. Normand Sloop (attending on call) - Patient stable for discharge with plans to follow up in the office - RTC for worsening symptoms  Charlotte Walton 04/16/2016, 1:45 PM

## 2016-04-18 ENCOUNTER — Encounter (HOSPITAL_COMMUNITY): Payer: Self-pay

## 2016-04-18 ENCOUNTER — Ambulatory Visit (HOSPITAL_COMMUNITY)
Admission: RE | Admit: 2016-04-18 | Discharge: 2016-04-18 | Disposition: A | Discharge status: Home / Self Care | Payer: Managed Care, Other (non HMO) | Source: Ambulatory Visit | Attending: Obstetrics and Gynecology | Admitting: Obstetrics and Gynecology

## 2016-04-18 ENCOUNTER — Other Ambulatory Visit (HOSPITAL_COMMUNITY): Payer: Self-pay | Admitting: Obstetrics and Gynecology

## 2016-04-18 DIAGNOSIS — R079 Chest pain, unspecified: Secondary | ICD-10-CM | POA: Insufficient documentation

## 2016-04-18 IMAGING — CT CT ANGIO CHEST
1 of 12 series · 15 of 36 positions shown · IV contrast (Iohexol (Omnipaque 350))
Comparison: None.

CLINICAL DATA: Shortness of breath and chest pain

EXAM:
CT ANGIOGRAPHY CHEST WITH CONTRAST
TECHNIQUE: Multidetector CT imaging of the chest was performed using the
standard protocol during bolus administration of intravenous
contrast. Multiplanar CT image reconstructions and MIPs were
obtained to evaluate the vascular anatomy.
CONTRAST:  100 mL Isovue 370 nonionic

[Series 806: thins pacs · axial · 0.61mm/px · z∈[+869,+1095]mm · 15 of 260 slices shown]
[im 17/260  lung]
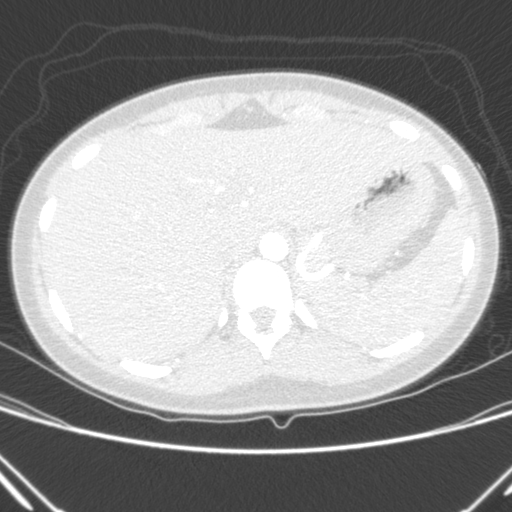
[im 33/260  mediastinal]
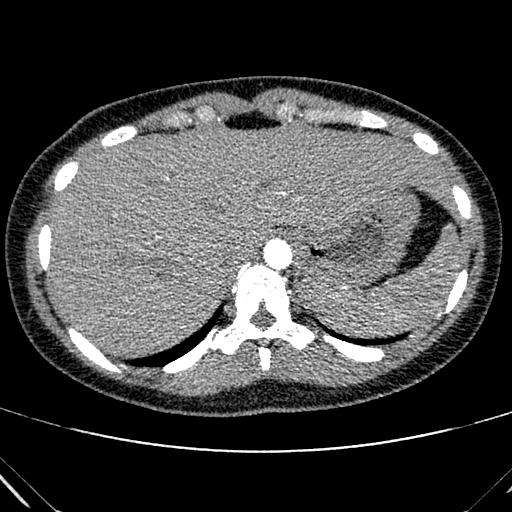
[im 49/260  lung]
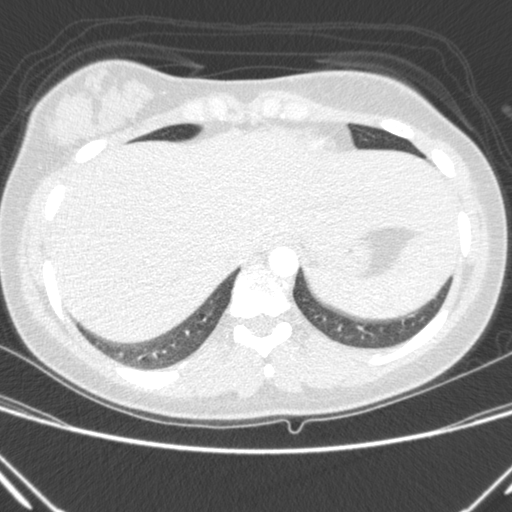
[im 65/260  mediastinal]
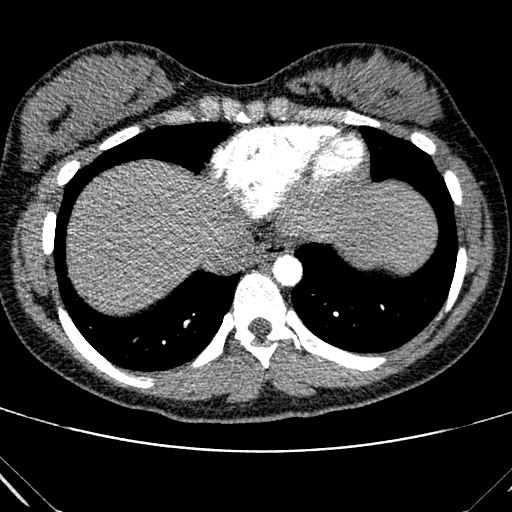
[im 81/260  lung]
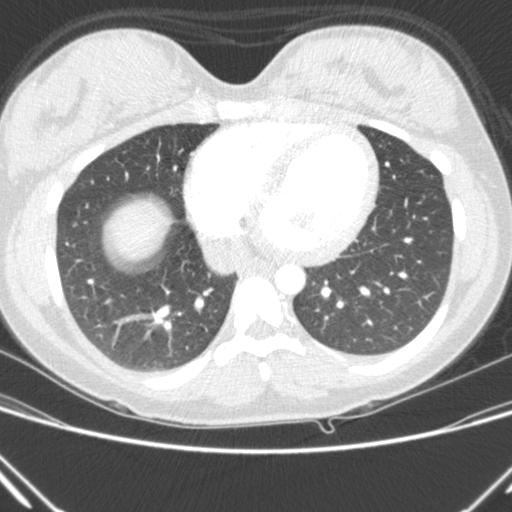
[im 98/260  mediastinal]
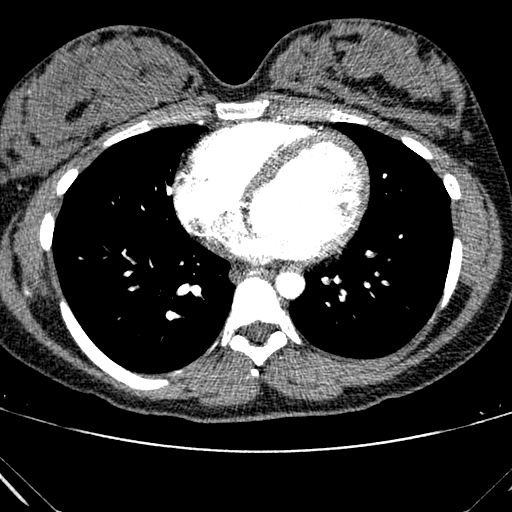
[im 114/260  lung]
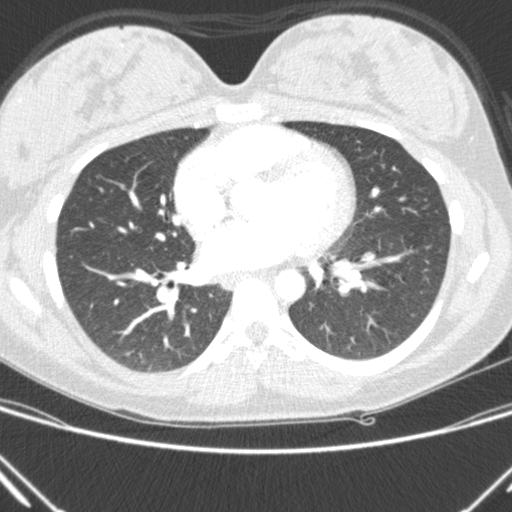
[im 130/260  mediastinal]
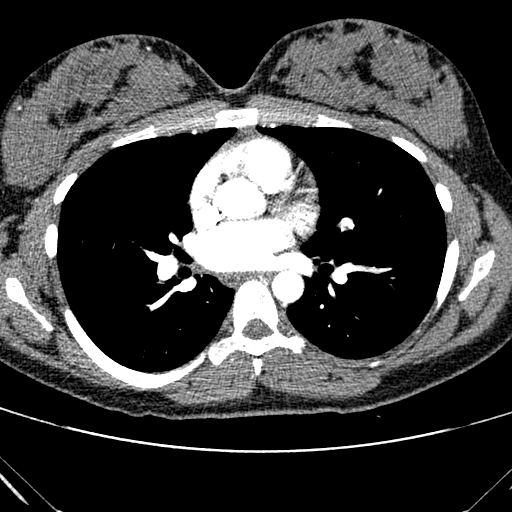
[im 146/260  lung]
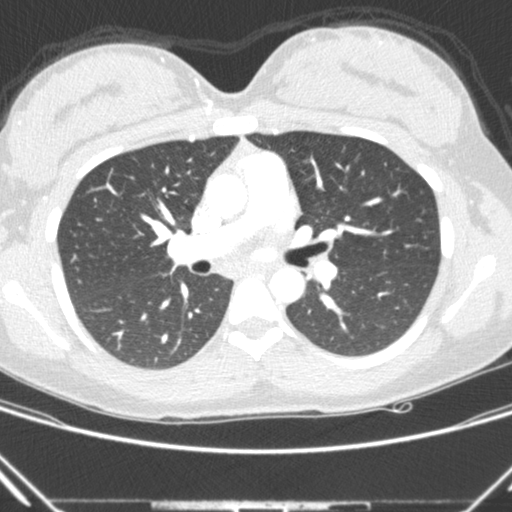
[im 162/260  mediastinal]
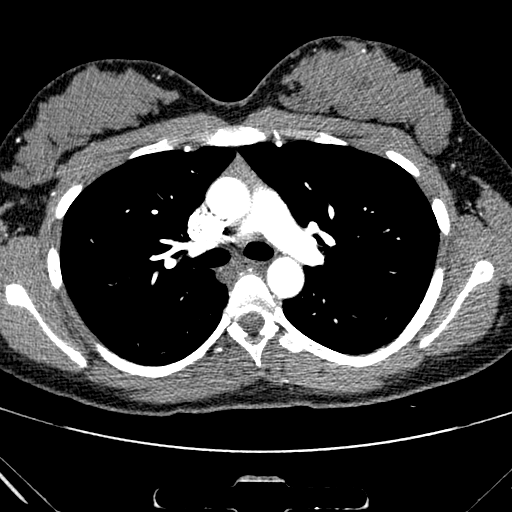
[im 179/260  lung]
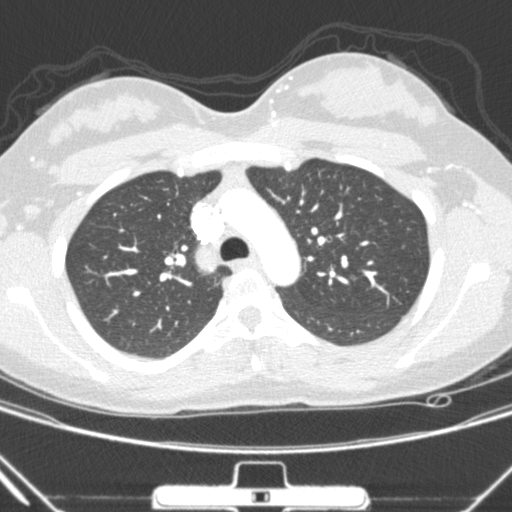
[im 195/260  mediastinal]
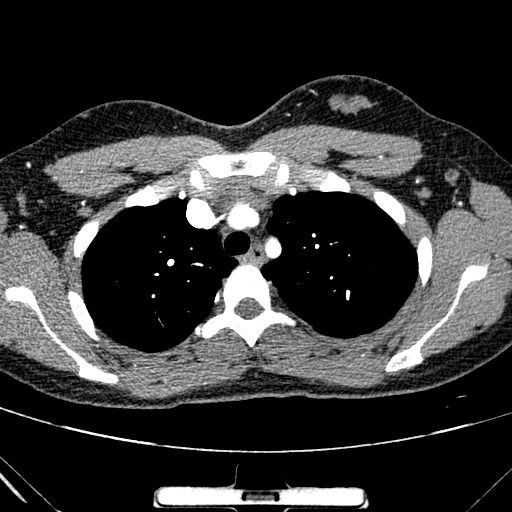
[im 211/260  lung]
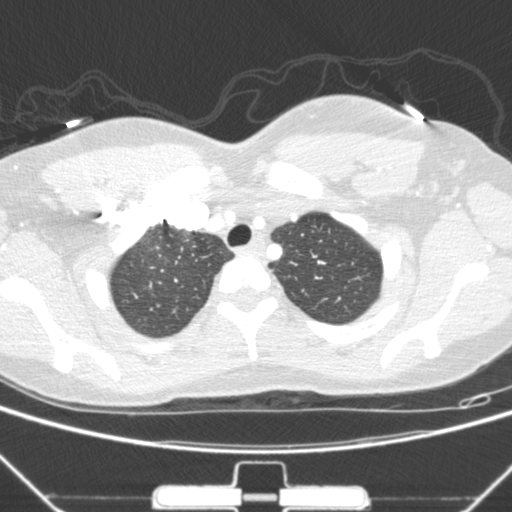
[im 227/260  mediastinal]
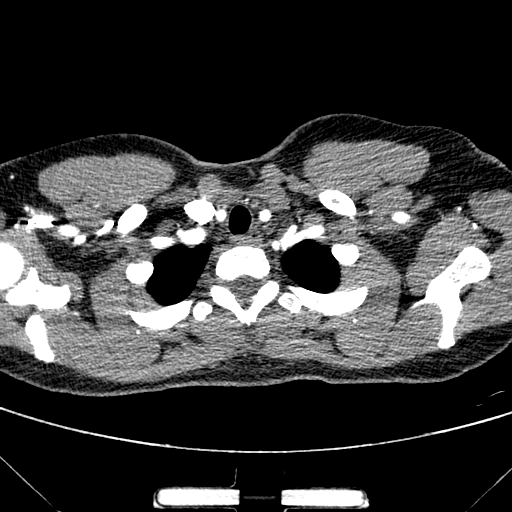
[im 243/260  lung]
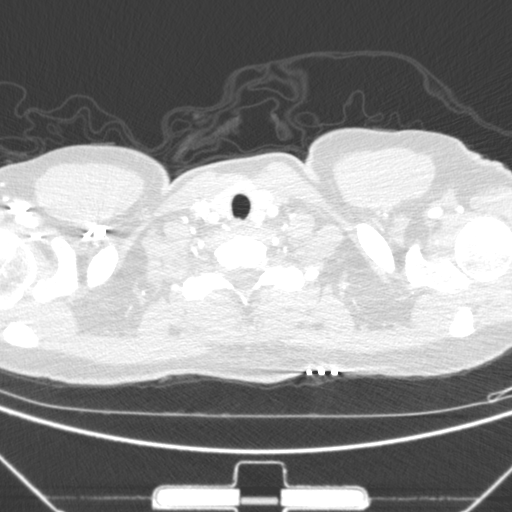

[15 of 36 positions shown; findings below may reference images not displayed]

FINDINGS: Cardiovascular: There is no demonstrable pulmonary embolus. There is
no appreciable thoracic aortic aneurysm or dissection. The visualize
great vessels appear normal. The left and right common carotid
arteries arise as a common trunk, an anatomic variant. Pericardium
is not appreciably thickened.

Mediastinum/Nodes: Visualized thyroid appears unremarkable. There is
residual thymic tissue, normal for age. There is no appreciable
thoracic adenopathy.

Lungs/Pleura: Lungs are clear.  No pleural abnormality evident.

Upper Abdomen: Visualized upper abdominal structures appear
unremarkable.

Musculoskeletal: There are no blastic or lytic bone lesions.

Review of the MIP images confirms the above findings.
IMPRESSION: No demonstrable pulmonary embolus. Thoracic aorta appears
unremarkable. Lungs clear. No adenopathy.

## 2016-04-18 MED ORDER — IOPAMIDOL (ISOVUE-370) INJECTION 76%
INTRAVENOUS | Status: AC
  Administered 2016-04-18: 100 mL
  Filled 2016-04-18: qty 100

## 2016-04-21 ENCOUNTER — Telehealth: Payer: Self-pay | Admitting: Internal Medicine

## 2016-04-21 DIAGNOSIS — R0602 Shortness of breath: Secondary | ICD-10-CM

## 2016-04-21 NOTE — Telephone Encounter (Signed)
Charlotte BurrowBrittany N Walton  Signed  Date of Service: 04/21/2016 12:24 PM  Bookmark Copy     New message  [facility] Estanislado Pandyivard office called requesting to speak with RN. Heather states Dr. Graciela HusbandsKlein requested for pt to have an echo ordered and an office visit after. No order is in the system for the echo. Heather states echo needs to be complete as soon as possible. Please callback to discuss          This message was initially taken in the patient's sister's chart. Melissa from scheduling came and spoke to Dr. Graciela HusbandsKlein in the office about the patient. Call received from the patient's OB-GYN on Friday (9/22).  The patient was having post-partum CP/ SOB- she was to be sent to the ER for a CT scan of her chest - if negative, cardiology would follow up- out team was notifed.  In reviewing her chart today- Dr. Graciela HusbandsKlein states the patient did not go to the ER. She is to have an echo/ PA follow up this week.  Melissa to work on scheduling.

## 2016-04-22 ENCOUNTER — Other Ambulatory Visit: Payer: Self-pay

## 2016-04-22 ENCOUNTER — Encounter: Payer: Self-pay | Admitting: Cardiology

## 2016-04-22 ENCOUNTER — Ambulatory Visit (HOSPITAL_COMMUNITY): Payer: Managed Care, Other (non HMO) | Attending: Cardiology

## 2016-04-22 DIAGNOSIS — I371 Nonrheumatic pulmonary valve insufficiency: Secondary | ICD-10-CM | POA: Diagnosis not present

## 2016-04-22 DIAGNOSIS — I071 Rheumatic tricuspid insufficiency: Secondary | ICD-10-CM | POA: Insufficient documentation

## 2016-04-22 DIAGNOSIS — R0602 Shortness of breath: Secondary | ICD-10-CM

## 2016-04-23 NOTE — Progress Notes (Signed)
Cardiology Office Note  NEW PATIENT NOTE  Date:  04/24/2016   ID:  [name], DOB 1991/01/07, MRN [id number]  PCP:  No primary care provider on file. OB GYN Dr. Estanislado Pandy at [facility]   Cardiologist:  NEW  Dr. Eden Emms  Chief Complaint  Patient presents with  . Chest Pain  . Shortness of Breath      History of Present Illness: Charlotte Walton is a 25 y.o. female who presents for chest pain and SOB post partum. She was preeclamptic-vaginal birth-  but BP has returned to controlled and she remains on labetalol only.    CT angio of chest neg for PE. Lungs lear, thoracic aorta appeared unremarkable.  On Echo EF 55-60%,  Normal echo trivial TR and PR.   Today she still has episodes of sharp chest pain and at other times SOB.  One episode lasted 8 hours and in that time it would come and go.  She has been taking ibuprofen but more as needed basis.  No nausea associated with this.  But she believes her appetite has decreased.    Past Medical History:  Diagnosis Date  . Medical history non-contributory   . Pregnancy induced hypertension     Past Surgical History:  Procedure Laterality Date  . NO PAST SURGERIES       Current Outpatient Prescriptions  Medication Sig Dispense Refill  . ibuprofen (ADVIL,MOTRIN) 600 MG tablet Take 600 mg by mouth every 6 (six) hours as needed (pain).    Marland Kitchen labetalol (NORMODYNE) 200 MG tablet Take 1 tablet (200 mg total) by mouth 3 (three) times daily. 90 tablet 2  . Prenat-FeFum-FePo-FA-Omega 3 (CONCEPT DHA) 53.5-38-1 MG CAPS Take 1 capsule by mouth daily.     No current facility-administered medications for this visit.     Allergies:   Review of patient's allergies indicates no known allergies.    Social History:  The patient  reports that she has never smoked. She has never used smokeless tobacco. She reports that she does not drink alcohol or use drugs.   Family History:  The patient's family history includes Depression in her sister; Drug  abuse in her maternal grandmother; Early death in her maternal grandmother; Hyperlipidemia in her mother; Hypertension in her maternal grandmother; Miscarriages / [place] in her sister; Stroke in her maternal grandmother.    ROS:  General:no colds or fevers, + weight loss with pregnancy Skin:no rashes or ulcers HEENT:no blurred vision, no congestion CV:see HPI PUL:see HPI GI:no diarrhea constipation or melena, no indigestion GU:no hematuria, no dysuria MS:no joint pain, no claudication Neuro:no syncope, no lightheadedness Endo:no diabetes, no thyroid disease  Wt Readings from Last 3 Encounters:  04/24/16 157 lb 12.8 oz (71.6 kg)  04/16/16 158 lb (71.7 kg)  04/09/16 199 lb 4.8 oz (90.4 kg)     PHYSICAL EXAM: VS:  BP 132/84   Pulse 82   Ht 5' 5.5" (1.664 m)   Wt 157 lb 12.8 oz (71.6 kg)   BMI 25.86 kg/m  , BMI Body mass index is 25.86 kg/m. General:Pleasant affect, NAD Skin:Warm and dry, brisk capillary refill HEENT:normocephalic, sclera clear, mucus membranes moist Neck:supple, no JVD, no bruits  Heart:S1S2 RRR without murmur, gallup, rub or click Lungs:clear without rales, rhonchi, or wheezes NWG:NFAO, non tender, + BS, do not palpate liver spleen or masses Ext:no lower ext edema, 2+ pedal pulses, 2+ radial pulses Neuro:alert and oriented, MAE, follows commands, + facial symmetry    EKG:  EKG is ordered  today. The ekg ordered today demonstrates SR non specific T wave abnormality.  No acute issues reviewed with Dr. Eden EmmsNishan.    Recent Labs: 04/09/2016: Magnesium 6.0 04/16/2016: ALT 25; BUN 22; Creatinine, Ser 1.16; Hemoglobin 12.2; Platelets 482; Potassium 4.0; Sodium 134    Lipid Panel    Component Value Date/Time   CHOL 131 03/14/2015 1644   TRIG 83 03/14/2015 1644   HDL 60 03/14/2015 1644       Other studies Reviewed: Additional studies/ records that were reviewed today include: . ECHO 04/22/16 Study Conclusions  - Left ventricle: The cavity size was  normal. Systolic function was   normal. The estimated ejection fraction was in the range of 55%   to 60%. Wall motion was normal; there were no regional wall   motion abnormalities. Left ventricular diastolic function   parameters were normal. There was no evidence of elevated   ventricular filling pressure by Doppler parameters. - Aortic valve: Trileaflet; normal thickness leaflets.   Transvalvular velocity was within the normal range. There was no   stenosis. There was no regurgitation. - Aortic root: The aortic root was normal in size. - Mitral valve: Structurally normal valve. - Left atrium: The atrium was normal in size. - Right ventricle: The cavity size was normal. Wall thickness was   normal. Systolic function was normal. - Right atrium: The atrium was normal in size. - Tricuspid valve: There was trivial regurgitation. - Pulmonic valve: There was trivial regurgitation. - Pulmonary arteries: Systolic pressure was within the normal   range. - Inferior vena cava: The vessel was normal in size. The   respirophasic diameter changes were in the normal range (= 50%),   consistent with normal central venous pressure. - Pericardium, extracardiac: There was no pericardial effusion.   CTA 04/18/16: FINDINGS: Cardiovascular: There is no demonstrable pulmonary embolus. There is no appreciable thoracic aortic aneurysm or dissection. The visualize great vessels appear normal. The left and right common carotid arteries arise as a common trunk, an anatomic variant. Pericardium is not appreciably thickened.  Mediastinum/Nodes: Visualized thyroid appears unremarkable. There is residual thymic tissue, normal for age. There is no appreciable thoracic adenopathy.  Lungs/Pleura: Lungs are clear.  No pleural abnormality evident.  Upper Abdomen: Visualized upper abdominal structures appear unremarkable.  Musculoskeletal: There are no blastic or lytic bone lesions.  Review of the MIP  images confirms the above findings.  IMPRESSION: No demonstrable pulmonary embolus. Thoracic aorta appears unremarkable. Lungs clear. No adenopathy.  ASSESSMENT AND PLAN:  1.  Chest pain non cardiac, discussed with Dr. Eden EmmsNishan he reviewed EKG, Echo and CTA.  We recommend ibuprofen 600 every 6 hours for 7 days then prn.  Reassured her it was not her heart.  She should follow up with Dr. Su Hiltoberts her GYN in 2 weeks for further eval. We were going to change to Naprosyn but not recommended for lactating mothers.  Other thought would be reflux.    2. HTN controlled   Current medicines are reviewed with the patient today.  The patient Has no concerns regarding medicines.  The following changes have been made:  See above Labs/ tests ordered today include:see above  Disposition:   FU:  see above  Signed, Nada BoozerLaura Sonoma Firkus, NP  04/24/2016 11:00 AM    [facility] [address], [place], [place]  [address]/ [address] [place], [place] Phone: [phone]; Fax: [phone]  [phone]

## 2016-04-24 ENCOUNTER — Ambulatory Visit (INDEPENDENT_AMBULATORY_CARE_PROVIDER_SITE_OTHER): Payer: Managed Care, Other (non HMO) | Admitting: Cardiology

## 2016-04-24 ENCOUNTER — Encounter: Payer: Self-pay | Admitting: Cardiology

## 2016-04-24 VITALS — BP 132/84 | HR 82 | Ht 65.5 in | Wt 157.8 lb

## 2016-04-24 DIAGNOSIS — R0602 Shortness of breath: Secondary | ICD-10-CM

## 2016-04-24 DIAGNOSIS — I10 Essential (primary) hypertension: Secondary | ICD-10-CM

## 2016-04-24 DIAGNOSIS — R079 Chest pain, unspecified: Secondary | ICD-10-CM | POA: Diagnosis not present

## 2016-04-24 MED ORDER — IBUPROFEN 600 MG PO TABS: 600.0000 mg | ORAL_TABLET | Freq: Four times a day (QID) | ORAL | 0 refills | Status: DC | PRN

## 2016-04-24 NOTE — Patient Instructions (Signed)
Your physician has recommended you make the following change in your medication:  IBUPROFEN  600 MG EVERY 6 HOURS  AS NEEDED FOR   PAIN  Your physician recommends that you schedule a follow-up appointment in:  AS NEEDED     FOLLOW UP   IN  1 -2  WEEKS  WITH OB  GYN  FOR   PAIN

## 2016-04-27 ENCOUNTER — Inpatient Hospital Stay (HOSPITAL_COMMUNITY)
Admission: AD | Admit: 2016-04-27 | Discharge: 2016-04-28 | Disposition: A | Discharge status: Home / Self Care | Payer: Managed Care, Other (non HMO) | Source: Ambulatory Visit | Attending: Obstetrics & Gynecology | Admitting: Obstetrics & Gynecology

## 2016-04-27 ENCOUNTER — Encounter (HOSPITAL_COMMUNITY): Payer: Self-pay

## 2016-04-27 DIAGNOSIS — R030 Elevated blood-pressure reading, without diagnosis of hypertension: Secondary | ICD-10-CM | POA: Diagnosis present

## 2016-04-27 DIAGNOSIS — O165 Unspecified maternal hypertension, complicating the puerperium: Secondary | ICD-10-CM | POA: Diagnosis not present

## 2016-04-27 DIAGNOSIS — O135 Gestational [pregnancy-induced] hypertension without significant proteinuria, complicating the puerperium: Secondary | ICD-10-CM | POA: Diagnosis not present

## 2016-04-27 LAB — URINE MICROSCOPIC-ADD ON: RBC / HPF: NONE SEEN RBC/hpf (ref 0–5)

## 2016-04-27 LAB — URINALYSIS, ROUTINE W REFLEX MICROSCOPIC
Bilirubin Urine: NEGATIVE
GLUCOSE, UA: NEGATIVE mg/dL
Ketones, ur: NEGATIVE mg/dL
LEUKOCYTES UA: NEGATIVE
Nitrite: NEGATIVE
PH: 6 (ref 5.0–8.0)
Specific Gravity, Urine: >1.03 — ABNORMAL HIGH (ref 1.005–1.030)

## 2016-04-27 NOTE — MAU Note (Addendum)
Pt s/p SVD on 04/08/16. Takes 200mg  Labetalol TID, last took at 8pm (2nd dose for the day). Was on HCTZ and Nifedipine, but was told to stop taking those medications recently (9/22). Denies HA, vision changes or RUQ pain. States that she has had HA's but they go away with ibuprofen. States that she has had 8lb weight gain since Thursday.

## 2016-04-27 NOTE — MAU Provider Note (Signed)
History   [id number]   Chief Complaint  Patient presents with  . Hypertension    HPI Tomorrow Charlotte Walton is a 25 y.o. female  G1P0101 here with report of elevated blood pressure.  NSVD on 04/12/16 s/p IOL for severe preeclampsia.  Discharged home with procardia, HCTZ, and labetalol.  Recently discontinued procardia and HCTZ (04/18/16).  Denies  headache, vision changes, or epigastric pain.     Patient's last menstrual period was 07/14/2015 (exact date).  OB History  Gravida Para Term Preterm AB Living  1 1 0 1 0 1  SAB TAB Ectopic Multiple Live Births  0 0 0 0 1    # Outcome Date GA Lbr Len/2nd Weight Sex Delivery Anes PTL Lv  1 Preterm 04/08/16 [redacted]w[redacted]d 06:13 / 00:16 3 lb 1.4 oz (1.4 kg) F Vag-Spont EPI  LIV      Past Medical History:  Diagnosis Date  . Medical history non-contributory   . Pregnancy induced hypertension     Family History  Problem Relation Age of Onset  . Hyperlipidemia Mother   . Miscarriages / Stillbirths Sister   . Depression Sister   . Early death Maternal Grandmother   . Drug abuse Maternal Grandmother   . Hypertension Maternal Grandmother   . Stroke Maternal Grandmother     Social History   Social History  . Marital status: Single    Spouse name: N/A  . Number of children: N/A  . Years of education: N/A   Social History Main Topics  . Smoking status: Never Smoker  . Smokeless tobacco: Never Used  . Alcohol use No     Comment: occ  . Drug use: No  . Sexual activity: Not Currently    Birth control/ protection: None   Other Topics Concern  . None   Social History Narrative  . None    No Known Allergies  No current facility-administered medications on file prior to encounter.    Current Outpatient Prescriptions on File Prior to Encounter  Medication Sig Dispense Refill  . ibuprofen (ADVIL,MOTRIN) 600 MG tablet Take 1 tablet (600 mg total) by mouth every 6 (six) hours as needed. 60 tablet 0  . labetalol (NORMODYNE) 200 MG tablet Take 1  tablet (200 mg total) by mouth 3 (three) times daily. 90 tablet 2  . Prenat-FeFum-FePo-FA-Omega 3 (CONCEPT DHA) 53.5-38-1 MG CAPS Take 1 capsule by mouth daily.    Marland Kitchen ibuprofen (ADVIL,MOTRIN) 600 MG tablet Take 600 mg by mouth every 6 (six) hours as needed (pain).       Review of Systems  Eyes: Negative for visual disturbance.  Gastrointestinal: Negative for abdominal pain.  Neurological: Negative for dizziness and headaches.  All other systems reviewed and are negative.    Physical Exam   Vitals:   04/27/16 2328 04/27/16 2343 04/27/16 2345  BP: (!) 145/105 (!) 156/103 155/99  Pulse: 70 66 65  Resp: 16    Temp: 98.3 F (36.8 C)    TempSrc: Oral    SpO2: 98%    Weight: 164 lb (74.4 kg)    Height: 5' 5.5" (1.664 m)      Physical Exam  Constitutional: She is oriented to person, place, and time. She appears well-developed and well-nourished. No distress.  HENT:  Head: Normocephalic.  Neck: Normal range of motion. Neck supple.  Cardiovascular: Normal rate, regular rhythm and normal heart sounds.   Respiratory: Effort normal and breath sounds normal. No respiratory distress.  Musculoskeletal: Normal range of motion. She exhibits edema (  2+ bilat pedal edema).  Neurological: She is alert and oriented to person, place, and time. She has normal reflexes.  Skin: Skin is warm and dry.    MAU Course  Procedures  MDM Results for orders placed or performed during the hospital encounter of 04/27/16 (from the past 24 hour(s))  Urinalysis, Routine w reflex microscopic (not at [facility])     Status: Abnormal   Collection Time: 04/27/16 11:30 PM  Result Value Ref Range   Color, Urine YELLOW YELLOW   APPearance CLEAR CLEAR   Specific Gravity, Urine >1.030 (H) 1.005 - 1.030   pH 6.0 5.0 - 8.0   Glucose, UA NEGATIVE NEGATIVE mg/dL   Hgb urine dipstick TRACE (A) NEGATIVE   Bilirubin Urine NEGATIVE NEGATIVE   Ketones, ur NEGATIVE NEGATIVE mg/dL   Protein, ur >161 (A) NEGATIVE mg/dL    Nitrite NEGATIVE NEGATIVE   Leukocytes, UA NEGATIVE NEGATIVE  Protein / creatinine ratio, urine     Status: Abnormal   Collection Time: 04/27/16 11:30 PM  Result Value Ref Range   Creatinine, Urine 228.00 mg/dL   Total Protein, Urine 613 mg/dL   Protein Creatinine Ratio 2.69 (H) 0.00 - 0.15 mg/mg[Cre]  Urine microscopic-add on     Status: Abnormal   Collection Time: 04/27/16 11:30 PM  Result Value Ref Range   Squamous Epithelial / LPF 6-30 (A) NONE SEEN   WBC, UA 6-30 0 - 5 WBC/hpf   RBC / HPF NONE SEEN 0 - 5 RBC/hpf   Bacteria, UA FEW (A) NONE SEEN   Urine-Other MUCOUS PRESENT   CBC     Status: Abnormal   Collection Time: 04/28/16 12:16 AM  Result Value Ref Range   WBC 7.4 4.0 - 10.5 K/uL   RBC 3.55 (L) 3.87 - 5.11 MIL/uL   Hemoglobin 11.0 (L) 12.0 - 15.0 g/dL   HCT 09.6 (L) 04.5 - 40.9 %   MCV 87.6 78.0 - 100.0 fL   MCH 31.0 26.0 - 34.0 pg   MCHC 35.4 30.0 - 36.0 g/dL   RDW 81.1 91.4 - 78.2 %   Platelets 270 150 - 400 K/uL  Comprehensive metabolic panel     Status: Abnormal   Collection Time: 04/28/16 12:16 AM  Result Value Ref Range   Sodium 141 135 - 145 mmol/L   Potassium 3.9 3.5 - 5.1 mmol/L   Chloride 111 101 - 111 mmol/L   CO2 27 22 - 32 mmol/L   Glucose, Bld 97 65 - 99 mg/dL   BUN 11 6 - 20 mg/dL   Creatinine, Ser 9.56 0.44 - 1.00 mg/dL   Calcium 8.2 (L) 8.9 - 10.3 mg/dL   Total Protein 5.4 (L) 6.5 - 8.1 g/dL   Albumin 2.6 (L) 3.5 - 5.0 g/dL   AST 28 15 - 41 U/L   ALT 22 14 - 54 U/L   Alkaline Phosphatase 51 38 - 126 U/L   Total Bilirubin 0.4 0.3 - 1.2 mg/dL   GFR calc non Af Amer >60 >60 mL/min   GFR calc Af Amer >60 >60 mL/min   Anion gap 3 (L) 5 - 15   0025 Consulted with Dr. Sallye Ober > Reviewed HPI/Exam > obtain labs, if similar to or better than last values, increase labetalol to 300 mg TID and have patient follow-up in office in a week for BP check.   Assessment and Plan  Postpartum Hypertension   Discharge to home Increase labetalol to 300 mg  TID Follow-up in office in one week  for blood pressure check  Marlis EdelsonWalidah N Karim, CNM 04/28/2016 1:11 AM

## 2016-04-27 NOTE — MAU Note (Signed)
Pt reports b/p 145/105 tonight and she is having increased swelling. S/p vaginal delivery 09/12

## 2016-04-28 DIAGNOSIS — O165 Unspecified maternal hypertension, complicating the puerperium: Secondary | ICD-10-CM | POA: Diagnosis not present

## 2016-04-28 LAB — CBC
HEMATOCRIT: 31.1 % — AB (ref 36.0–46.0)
Hemoglobin: 11 g/dL — ABNORMAL LOW (ref 12.0–15.0)
MCH: 31 pg (ref 26.0–34.0)
MCHC: 35.4 g/dL (ref 30.0–36.0)
MCV: 87.6 fL (ref 78.0–100.0)
Platelets: 270 10*3/uL (ref 150–400)
RBC: 3.55 MIL/uL — AB (ref 3.87–5.11)
RDW: 12.7 % (ref 11.5–15.5)
WBC: 7.4 10*3/uL (ref 4.0–10.5)

## 2016-04-28 LAB — PROTEIN / CREATININE RATIO, URINE
Creatinine, Urine: 228 mg/dL
PROTEIN CREATININE RATIO: 2.69 mg/mg{creat} — AB (ref 0.00–0.15)
Total Protein, Urine: 613 mg/dL

## 2016-04-28 LAB — COMPREHENSIVE METABOLIC PANEL
ALBUMIN: 2.6 g/dL — AB (ref 3.5–5.0)
ALK PHOS: 51 U/L (ref 38–126)
ALT: 22 U/L (ref 14–54)
AST: 28 U/L (ref 15–41)
Anion gap: 3 — ABNORMAL LOW (ref 5–15)
BILIRUBIN TOTAL: 0.4 mg/dL (ref 0.3–1.2)
BUN: 11 mg/dL (ref 6–20)
CALCIUM: 8.2 mg/dL — AB (ref 8.9–10.3)
CO2: 27 mmol/L (ref 22–32)
Chloride: 111 mmol/L (ref 101–111)
Creatinine, Ser: 0.92 mg/dL (ref 0.44–1.00)
GFR calc Af Amer: >60 mL/min (ref >60)
GFR calc non Af Amer: >60 mL/min (ref >60)
GLUCOSE: 97 mg/dL (ref 65–99)
POTASSIUM: 3.9 mmol/L (ref 3.5–5.1)
SODIUM: 141 mmol/L (ref 135–145)
TOTAL PROTEIN: 5.4 g/dL — AB (ref 6.5–8.1)

## 2016-04-28 MED ORDER — LABETALOL HCL 100 MG PO TABS
100.0000 mg | ORAL_TABLET | Freq: Once | ORAL | Status: AC
  Administered 2016-04-28: 100 mg via ORAL
  Filled 2016-04-28: qty 1

## 2016-04-28 MED ORDER — LABETALOL HCL 300 MG PO TABS: 300.0000 mg | ORAL_TABLET | Freq: Three times a day (TID) | ORAL | 2 refills | Status: AC

## 2016-04-28 MED ORDER — LABETALOL HCL 100 MG PO TABS
200.0000 mg | ORAL_TABLET | Freq: Once | ORAL | Status: AC
  Administered 2016-04-28: 200 mg via ORAL
  Filled 2016-04-28: qty 2

## 2016-04-28 MED ORDER — NIFEDIPINE 10 MG PO CAPS: 10.0000 mg | ORAL_CAPSULE | Freq: Once | ORAL | Status: DC

## 2016-05-01 ENCOUNTER — Telehealth: Payer: Self-pay

## 2016-05-01 NOTE — Telephone Encounter (Signed)
Pt is aware of results. She stated that she had to come in for another appt was informed of echo results. I reiterated to her that her EF was normal and that Dr. Graciela HusbandsKlein did not feel like her SOB was cardiac related. She informed me that she was placed on a new medication but was unaware of the name of it because he BP was high. She asked me could we tell changes in her BP based on the echo. I informed her that an echo is not a proper test to determined increased or decreases in BP, but more so to see the heart muscles, ventricles, blood flow, and pumping action of the heart. She understood and expressed no other questions. I told her if she thought of anything else or had any other question to call back.

## 2016-05-08 ENCOUNTER — Ambulatory Visit: Payer: Self-pay

## 2016-05-08 NOTE — Lactation Note (Signed)
This note was copied from a baby's chart. Lactation Consultation Note  Patient Name: Charlotte Walton XBJYN'WToday's Date: 05/08/2016 Reason for consult: Follow-up assessment;NICU baby  NICU baby 634 weeks old. Mom reports that she just fed the baby EBM/formula by bottle. Mom states that the baby latches and nurses for a short while, but then pushes off the breast. Discussed with mom that it is more work to nurse than take a bottle. Enc mom to put baby to breast first with each feeding and then supplement afterwards. Enc mom to pump after baby fed. Enc mom to make an outpatient appointment, but mom declined stating that she wanted to try to nurse at home first, then call. Mom aware of how to make an OP appointment, BFSG and LC phone line assistance after D/C.   Maternal Data    Feeding    LATCH Score/Interventions                      Lactation Tools Discussed/Used     Consult Status Consult Status: PRN    Sherlyn HayJennifer D Harlynn Kimbell 05/08/2016, 1:03 PM
